# Patient Record
Sex: Female | Born: 1950
Health system: Southern US, Community
[De-identification: ages and names within clinical notes are randomized; demographics above are authoritative.]

## PROBLEM LIST (undated history)

## (undated) DIAGNOSIS — C50911 Malignant neoplasm of unspecified site of right female breast: Secondary | ICD-10-CM

## (undated) DIAGNOSIS — Z1371 Encounter for nonprocreative screening for genetic disease carrier status: Secondary | ICD-10-CM

## (undated) DIAGNOSIS — G709 Myoneural disorder, unspecified: Secondary | ICD-10-CM

## (undated) DIAGNOSIS — F419 Anxiety disorder, unspecified: Secondary | ICD-10-CM

## (undated) DIAGNOSIS — E7800 Pure hypercholesterolemia, unspecified: Secondary | ICD-10-CM

## (undated) DIAGNOSIS — I10 Essential (primary) hypertension: Secondary | ICD-10-CM

## (undated) HISTORY — PX: LUMBAR LAMINECTOMY: SHX95

## (undated) HISTORY — PX: BACK SURGERY: SHX140

## (undated) HISTORY — PX: BREAST BIOPSY: SHX20

## (undated) HISTORY — PX: ABDOMINAL HYSTERECTOMY: SHX81

---

## 1997-05-14 ENCOUNTER — Ambulatory Visit (HOSPITAL_COMMUNITY): Admission: RE | Admit: 1997-05-14 | Discharge: 1997-05-14 | Payer: Self-pay | Admitting: *Deleted

## 1998-03-07 ENCOUNTER — Ambulatory Visit (HOSPITAL_COMMUNITY): Admission: RE | Admit: 1998-03-07 | Discharge: 1998-03-07 | Payer: Self-pay | Admitting: *Deleted

## 1999-04-19 ENCOUNTER — Encounter: Payer: Self-pay | Admitting: General Surgery

## 1999-04-19 ENCOUNTER — Encounter: Admission: RE | Admit: 1999-04-19 | Discharge: 1999-04-19 | Payer: Self-pay | Admitting: General Surgery

## 2000-04-19 ENCOUNTER — Encounter: Admission: RE | Admit: 2000-04-19 | Discharge: 2000-04-19 | Payer: Self-pay | Admitting: *Deleted

## 2001-07-18 ENCOUNTER — Encounter: Payer: Self-pay | Admitting: *Deleted

## 2001-07-18 ENCOUNTER — Encounter: Admission: RE | Admit: 2001-07-18 | Discharge: 2001-07-18 | Payer: Self-pay | Admitting: *Deleted

## 2002-07-29 ENCOUNTER — Encounter: Payer: Self-pay | Admitting: *Deleted

## 2002-07-29 ENCOUNTER — Encounter: Admission: RE | Admit: 2002-07-29 | Discharge: 2002-07-29 | Payer: Self-pay | Admitting: *Deleted

## 2004-01-18 ENCOUNTER — Encounter: Admission: RE | Admit: 2004-01-18 | Discharge: 2004-01-18 | Payer: Self-pay | Admitting: *Deleted

## 2005-07-09 ENCOUNTER — Encounter: Admission: RE | Admit: 2005-07-09 | Discharge: 2005-07-09 | Payer: Self-pay | Admitting: *Deleted

## 2006-11-11 ENCOUNTER — Encounter: Admission: RE | Admit: 2006-11-11 | Discharge: 2006-11-11 | Payer: Self-pay | Admitting: *Deleted

## 2008-02-24 ENCOUNTER — Encounter: Admission: RE | Admit: 2008-02-24 | Discharge: 2008-02-24 | Payer: Self-pay | Admitting: Family Medicine

## 2009-06-07 ENCOUNTER — Encounter: Admission: RE | Admit: 2009-06-07 | Discharge: 2009-06-07 | Payer: Self-pay | Admitting: *Deleted

## 2010-01-22 ENCOUNTER — Encounter: Payer: Self-pay | Admitting: *Deleted

## 2010-07-19 ENCOUNTER — Other Ambulatory Visit: Payer: Self-pay | Admitting: Family Medicine

## 2010-07-19 DIAGNOSIS — Z1231 Encounter for screening mammogram for malignant neoplasm of breast: Secondary | ICD-10-CM

## 2010-08-08 ENCOUNTER — Ambulatory Visit
Admission: RE | Admit: 2010-08-08 | Discharge: 2010-08-08 | Disposition: A | Discharge status: Home / Self Care | Payer: BC Managed Care – PPO | Source: Ambulatory Visit | Attending: Family Medicine | Admitting: Family Medicine

## 2010-08-08 DIAGNOSIS — Z1231 Encounter for screening mammogram for malignant neoplasm of breast: Secondary | ICD-10-CM

## 2011-09-14 ENCOUNTER — Other Ambulatory Visit: Payer: Self-pay | Admitting: Family Medicine

## 2011-09-14 ENCOUNTER — Other Ambulatory Visit: Payer: Self-pay | Admitting: *Deleted

## 2011-09-14 DIAGNOSIS — Z1231 Encounter for screening mammogram for malignant neoplasm of breast: Secondary | ICD-10-CM

## 2011-10-12 ENCOUNTER — Ambulatory Visit: Payer: BC Managed Care – PPO

## 2011-11-12 ENCOUNTER — Ambulatory Visit: Payer: BC Managed Care – PPO

## 2011-12-20 ENCOUNTER — Ambulatory Visit: Payer: BC Managed Care – PPO

## 2012-01-10 ENCOUNTER — Other Ambulatory Visit: Payer: Self-pay | Admitting: Family Medicine

## 2012-01-10 ENCOUNTER — Ambulatory Visit
Admission: RE | Admit: 2012-01-10 | Discharge: 2012-01-10 | Disposition: A | Discharge status: Home / Self Care | Payer: BC Managed Care – PPO | Source: Ambulatory Visit | Attending: Family Medicine | Admitting: Family Medicine

## 2012-01-10 ENCOUNTER — Ambulatory Visit
Admission: RE | Admit: 2012-01-10 | Discharge: 2012-01-10 | Disposition: A | Discharge status: Home / Self Care | Payer: BC Managed Care – PPO | Source: Ambulatory Visit | Attending: *Deleted | Admitting: *Deleted

## 2012-01-10 DIAGNOSIS — N6310 Unspecified lump in the right breast, unspecified quadrant: Secondary | ICD-10-CM

## 2012-01-10 DIAGNOSIS — Z1231 Encounter for screening mammogram for malignant neoplasm of breast: Secondary | ICD-10-CM

## 2013-03-09 ENCOUNTER — Other Ambulatory Visit: Payer: Self-pay

## 2013-03-09 DIAGNOSIS — Z1231 Encounter for screening mammogram for malignant neoplasm of breast: Secondary | ICD-10-CM

## 2013-03-27 ENCOUNTER — Ambulatory Visit
Admission: RE | Admit: 2013-03-27 | Discharge: 2013-03-27 | Disposition: A | Discharge status: Home / Self Care | Payer: BC Managed Care – PPO | Source: Ambulatory Visit

## 2013-03-27 ENCOUNTER — Ambulatory Visit
Admission: RE | Admit: 2013-03-27 | Discharge: 2013-03-27 | Disposition: A | Discharge status: Home / Self Care | Payer: BC Managed Care – PPO | Source: Ambulatory Visit | Attending: Family Medicine | Admitting: Family Medicine

## 2013-03-27 ENCOUNTER — Other Ambulatory Visit: Payer: Self-pay | Admitting: Family Medicine

## 2013-03-27 DIAGNOSIS — N6310 Unspecified lump in the right breast, unspecified quadrant: Secondary | ICD-10-CM

## 2013-03-27 DIAGNOSIS — Z1231 Encounter for screening mammogram for malignant neoplasm of breast: Secondary | ICD-10-CM

## 2014-05-24 ENCOUNTER — Other Ambulatory Visit: Payer: Self-pay

## 2014-05-24 DIAGNOSIS — Z1231 Encounter for screening mammogram for malignant neoplasm of breast: Secondary | ICD-10-CM

## 2014-06-18 ENCOUNTER — Ambulatory Visit
Admission: RE | Admit: 2014-06-18 | Discharge: 2014-06-18 | Disposition: A | Discharge status: Home / Self Care | Payer: BC Managed Care – PPO | Source: Ambulatory Visit

## 2014-06-18 DIAGNOSIS — Z1231 Encounter for screening mammogram for malignant neoplasm of breast: Secondary | ICD-10-CM

## 2015-01-02 DIAGNOSIS — C50911 Malignant neoplasm of unspecified site of right female breast: Secondary | ICD-10-CM

## 2015-01-02 HISTORY — DX: Malignant neoplasm of unspecified site of right female breast: C50.911

## 2015-01-02 HISTORY — PX: BREAST BIOPSY: SHX20

## 2015-07-22 ENCOUNTER — Other Ambulatory Visit: Payer: Self-pay | Admitting: Family Medicine

## 2015-07-22 DIAGNOSIS — Z1231 Encounter for screening mammogram for malignant neoplasm of breast: Secondary | ICD-10-CM

## 2015-08-02 ENCOUNTER — Ambulatory Visit: Payer: BC Managed Care – PPO

## 2015-08-08 ENCOUNTER — Ambulatory Visit
Admission: RE | Admit: 2015-08-08 | Discharge: 2015-08-08 | Disposition: A | Discharge status: Home / Self Care | Payer: BC Managed Care – PPO | Source: Ambulatory Visit | Attending: Family Medicine | Admitting: Family Medicine

## 2015-08-08 DIAGNOSIS — Z1231 Encounter for screening mammogram for malignant neoplasm of breast: Secondary | ICD-10-CM

## 2015-08-09 ENCOUNTER — Other Ambulatory Visit: Payer: Self-pay | Admitting: Family Medicine

## 2015-08-09 DIAGNOSIS — R928 Other abnormal and inconclusive findings on diagnostic imaging of breast: Secondary | ICD-10-CM

## 2015-08-10 ENCOUNTER — Ambulatory Visit
Admission: RE | Admit: 2015-08-10 | Discharge: 2015-08-10 | Disposition: A | Discharge status: Home / Self Care | Payer: BC Managed Care – PPO | Source: Ambulatory Visit | Attending: Family Medicine | Admitting: Family Medicine

## 2015-08-10 ENCOUNTER — Other Ambulatory Visit: Payer: Self-pay | Admitting: Family Medicine

## 2015-08-10 ENCOUNTER — Other Ambulatory Visit: Payer: BC Managed Care – PPO

## 2015-08-10 DIAGNOSIS — IMO0002 Reserved for concepts with insufficient information to code with codable children: Secondary | ICD-10-CM

## 2015-08-10 DIAGNOSIS — R928 Other abnormal and inconclusive findings on diagnostic imaging of breast: Secondary | ICD-10-CM

## 2015-08-10 DIAGNOSIS — R229 Localized swelling, mass and lump, unspecified: Principal | ICD-10-CM

## 2015-08-10 DIAGNOSIS — N6310 Unspecified lump in the right breast, unspecified quadrant: Secondary | ICD-10-CM

## 2015-08-18 ENCOUNTER — Other Ambulatory Visit: Payer: Self-pay | Admitting: General Surgery

## 2015-08-18 DIAGNOSIS — C50911 Malignant neoplasm of unspecified site of right female breast: Secondary | ICD-10-CM

## 2015-08-19 ENCOUNTER — Encounter: Payer: Self-pay | Admitting: Oncology

## 2015-08-19 ENCOUNTER — Telehealth: Payer: Self-pay | Admitting: Oncology

## 2015-08-19 NOTE — Telephone Encounter (Signed)
Pt confirmed both genetic and onc appts, answered questions, completed intake, mailed pt letter, faxed appt to [facility]

## 2015-08-26 ENCOUNTER — Ambulatory Visit
Admission: RE | Admit: 2015-08-26 | Discharge: 2015-08-26 | Disposition: A | Discharge status: Home / Self Care | Payer: BC Managed Care – PPO | Source: Ambulatory Visit | Attending: General Surgery | Admitting: General Surgery

## 2015-08-26 DIAGNOSIS — C50911 Malignant neoplasm of unspecified site of right female breast: Secondary | ICD-10-CM

## 2015-08-26 MED ORDER — GADOBENATE DIMEGLUMINE 529 MG/ML IV SOLN
17.0000 mL | Freq: Once | INTRAVENOUS | Status: AC | PRN
  Administered 2015-08-26: 17 mL via INTRAVENOUS

## 2015-08-29 DIAGNOSIS — C50411 Malignant neoplasm of upper-outer quadrant of right female breast: Secondary | ICD-10-CM | POA: Insufficient documentation

## 2015-08-29 NOTE — Assessment & Plan Note (Deleted)
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 Clinical staging: T2 N0 stage IIa  Pathology and radiology counseling:Discussed with the patient, the details of pathology including the type of breast cancer,the clinical staging, the significance of ER, PR and HER-2/neu receptors and the implications for treatment. After reviewing the pathology in detail, we proceeded to discuss the different treatment options between surgery, radiation, chemotherapy, antiestrogen therapies.  Recommendations: 1. Breast conserving surgery versus mastectomy followed by 2. Oncotype DX testing to determine if chemotherapy would be of any benefit followed by 3. Adjuvant radiation therapy followed by 4. Adjuvant antiestrogen therapy  Oncotype counseling: I discussed Oncotype DX test. I explained to the patient that this is a 21 gene panel to evaluate patient tumors DNA to calculate recurrence score. This would help determine whether patient has high risk or intermediate risk or low risk breast cancer. She understands that if her tumor was found to be high risk, she would benefit from systemic chemotherapy. If low risk, no need of chemotherapy. If she was found to be intermediate risk, we would need to evaluate the score as well as other risk factors and determine if an abbreviated chemotherapy may be of benefit.  Plan: Radiology recommended additional biopsies if breast conserving therapy is being planned. I discussed with Dr. Dalbert Batman regarding the MRI findings.  Return to clinic after surgery to discuss final pathology report and then determine if Oncotype DX testing will need to be sent.

## 2015-08-30 ENCOUNTER — Other Ambulatory Visit: Payer: BC Managed Care – PPO

## 2015-08-30 ENCOUNTER — Encounter: Payer: Self-pay | Admitting: Hematology and Oncology

## 2015-08-30 ENCOUNTER — Encounter: Payer: Self-pay | Admitting: *Deleted

## 2015-08-30 ENCOUNTER — Ambulatory Visit (HOSPITAL_BASED_OUTPATIENT_CLINIC_OR_DEPARTMENT_OTHER): Payer: Self-pay | Admitting: Genetic Counselor

## 2015-08-30 ENCOUNTER — Ambulatory Visit: Payer: BC Managed Care – PPO | Admitting: Oncology

## 2015-08-30 ENCOUNTER — Ambulatory Visit (HOSPITAL_BASED_OUTPATIENT_CLINIC_OR_DEPARTMENT_OTHER): Payer: BC Managed Care – PPO | Admitting: Hematology and Oncology

## 2015-08-30 DIAGNOSIS — C50411 Malignant neoplasm of upper-outer quadrant of right female breast: Secondary | ICD-10-CM

## 2015-08-30 DIAGNOSIS — Z809 Family history of malignant neoplasm, unspecified: Secondary | ICD-10-CM

## 2015-08-30 DIAGNOSIS — Z808 Family history of malignant neoplasm of other organs or systems: Secondary | ICD-10-CM

## 2015-08-30 DIAGNOSIS — Z8041 Family history of malignant neoplasm of ovary: Secondary | ICD-10-CM

## 2015-08-30 DIAGNOSIS — D0591 Unspecified type of carcinoma in situ of right breast: Secondary | ICD-10-CM | POA: Diagnosis not present

## 2015-08-30 DIAGNOSIS — Z800 Family history of malignant neoplasm of digestive organs: Secondary | ICD-10-CM

## 2015-08-30 DIAGNOSIS — Z801 Family history of malignant neoplasm of trachea, bronchus and lung: Secondary | ICD-10-CM

## 2015-08-30 DIAGNOSIS — Z170 Estrogen receptor positive status [ER+]: Secondary | ICD-10-CM

## 2015-08-30 DIAGNOSIS — Z85828 Personal history of other malignant neoplasm of skin: Secondary | ICD-10-CM

## 2015-08-30 DIAGNOSIS — Z803 Family history of malignant neoplasm of breast: Secondary | ICD-10-CM

## 2015-08-30 MED ORDER — ANASTROZOLE 1 MG PO TABS: 1.0000 mg | ORAL_TABLET | Freq: Every day | ORAL | 3 refills | Status: DC

## 2015-08-30 NOTE — Progress Notes (Signed)
[place] CONSULT NOTE  Consulting physician: Dr. Dalbert Batman  CHIEF COMPLAINTS/PURPOSE OF CONSULTATION:  Newly diagnosed breast cancer  HISTORY OF PRESENTING ILLNESS:  Stephanie Hinton 65 y.o. female is here because of recent diagnosis of right breast cancer. Patient had a routine screening mammogram the detected abnormality in the right breast this was further evaluated by ultrasound. By ultrasound it measured 2.8 cm. 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 It was grade 2. she saw Dr. Dalbert Batman who obtained a breast MRI. The MRI done recently revealed that they were multicentric breast disease with multiple lesions that measure 2.1 cm down to 0.8 cm. She is also seeing genetics for genetic testing because her sister was diagnosed breast cancer age 60. She is extremely anxious and worried about the time it's taking for her surgery.  I reviewed her records extensively and collaborated the history with the patient.  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 She does not have any children. FAMILY HISTORY: Sister diagnosed with breast cancers 24 apparently was BRCA negative. ALLERGIES:  is allergic to  penicillin g.  MEDICATIONS:  Current Outpatient Prescriptions  Medication Sig Dispense Refill  . atorvastatin (LIPITOR) 20 MG tablet TAKE 1 TABLET DAILY    . citalopram (CELEXA) 20 MG tablet TAKE 1 TABLET DAILY    . metoprolol succinate (TOPROL-XL) 25 MG 24 hr tablet TAKE 1 TABLET DAILY    . anastrozole (ARIMIDEX) 1 MG tablet Take 1 tablet (1 mg total) by mouth daily. 90 tablet 3   No current facility-administered medications for this visit.     REVIEW OF SYSTEMS:   Constitutional: Denies fevers, chills or abnormal night sweats Eyes: Denies blurriness of vision, double vision or watery eyes Ears, nose, mouth, throat, and face: Denies mucositis or sore throat Respiratory: Denies cough, dyspnea or wheezes Cardiovascular: Denies palpitation, chest discomfort or lower extremity swelling Gastrointestinal:  Denies nausea, heartburn or change in bowel habits Skin: Denies abnormal skin rashes Lymphatics: Denies new lymphadenopathy or easy bruising Neurological:Denies numbness, tingling or new weaknesses Behavioral/Psych: Mood is stable, no new changes  Breast:  Denies any palpable lumps or discharge All other systems were reviewed with the patient and are negative.  PHYSICAL EXAMINATION: ECOG PERFORMANCE STATUS: 0 - Asymptomatic  Vitals:   08/30/15 1235  BP: (!) 140/91  Pulse: 71  Resp: 18  Temp: 98.3 F (36.8 C)   Filed Weights   08/30/15 1235  Weight: 178 lb 11.2 oz (81.1 kg)    GENERAL:alert, no distress and comfortable SKIN: skin color, texture, turgor are normal, no rashes or significant lesions EYES: normal, conjunctiva are pink and non-injected, sclera clear OROPHARYNX:no exudate, no erythema and lips, buccal mucosa, and tongue normal  NECK: supple, thyroid normal size, non-tender, without nodularity LYMPH:  no palpable lymphadenopathy in the cervical, axillary or inguinal LUNGS: clear to auscultation and percussion with normal breathing effort HEART: regular rate &  rhythm and no murmurs and no lower extremity edema ABDOMEN:abdomen soft, non-tender and normal bowel sounds Musculoskeletal:no cyanosis of digits and no clubbing  PSYCH: alert & oriented x 3 with fluent speech NEURO: no focal motor/sensory deficits BREAST: No palpable nodules in breast. No palpable axillary or supraclavicular lymphadenopathy (exam performed in the presence of a chaperone)   RADIOGRAPHIC STUDIES: I have personally reviewed the radiological reports and agreed with the findings in the report.  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 After reviewing the pathology in detail, we proceeded to discuss the different treatment options between surgery, radiation, chemotherapy, antiestrogen therapies.  Recommendations: 1. Mastectomy  2. Oncotype DX testing to determine if chemotherapy would be of any benefit followed by 3. Adjuvant radiation therapy if necessary based upon final tumor characteristics followed by 4. Adjuvant antiestrogen therapy  Oncotype counseling: I discussed Oncotype DX test. I explained to the patient that this is a 21 gene panel to evaluate patient tumors DNA to calculate recurrence score. This would help determine whether patient has high risk or intermediate risk or low risk breast cancer. She understands that  if her tumor was found to be high risk, she would benefit from systemic chemotherapy. If low risk, no need of chemotherapy. If she was found to be intermediate risk, we would need to evaluate the score as well as other risk factors and determine if an abbreviated chemotherapy may be of benefit.  Plan: 1. Genetic testing today 2. Oncotype DX on the biopsy 3. Start anastrozole 1 mg daily today  Return to clinic one week after final surgery to discuss the final treatment plan.    All questions were answered. The patient knows to call the clinic with any problems, questions or concerns.    Rulon Eisenmenger, MD 08/30/15

## 2015-08-30 NOTE — Assessment & Plan Note (Signed)
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 After reviewing the pathology in detail, we proceeded to discuss the different treatment options between surgery, radiation, chemotherapy, antiestrogen therapies.  Recommendations: 1. Mastectomy  2. Oncotype DX testing to determine if chemotherapy would be of any benefit followed by 3. Adjuvant radiation therapy if necessary based upon final tumor characteristics followed by 4. Adjuvant antiestrogen therapy  Oncotype counseling: I discussed Oncotype DX test. I explained to the patient that this is a 21 gene panel to evaluate patient tumors DNA to calculate recurrence score. This would help determine whether patient has high risk or intermediate risk or low risk breast cancer. She understands that if her tumor was found to be high risk, she would benefit from systemic chemotherapy. If low risk, no need of chemotherapy. If she was found to be intermediate risk, we would need to evaluate the score as well as other risk factors and determine if an abbreviated chemotherapy may be of benefit.  Plan: 1. Genetic testing today 2. Oncotype DX on the biopsy 3. Start anastrozole 1 mg daily today  Return to clinic one week after final surgery to discuss the final treatment plan.

## 2015-08-31 ENCOUNTER — Telehealth: Payer: Self-pay | Admitting: *Deleted

## 2015-08-31 ENCOUNTER — Encounter: Payer: Self-pay | Admitting: Genetic Counselor

## 2015-08-31 DIAGNOSIS — Z803 Family history of malignant neoplasm of breast: Secondary | ICD-10-CM | POA: Insufficient documentation

## 2015-08-31 NOTE — Telephone Encounter (Signed)
Received order per Dr. Lindi Adie for oncotype testing. Requisition sent to pathology. Received by Varney Biles. PAC sent to [facility]

## 2015-08-31 NOTE — Progress Notes (Signed)
REFERRING PROVIDER: Claud Kelp, MD [address] [place], [place] [address]  PRIMARY PROVIDER:  No primary care provider on file.  PRIMARY REASON FOR VISIT:  1. Breast cancer of upper-outer quadrant of right female breast ([facility]CC)   2. Family history of breast cancer in first degree relative   3. Family history of malignant neoplasm of gastrointestinal tract   4. Family history of ovarian cancer   5. Family history of lung cancer   6. Family history of skin cancer   7. History of basal cell carcinoma   8. Family history of cancer      HISTORY OF PRESENT ILLNESS:   Ms. Rajewski, a 65 y.o. female, was seen for a [facility] cancer genetics consultation at the request of Dr. Derrell Lolling due to a personal history of breast cancer and family history of breast, early stage ovarian, GI, and other cancers.  Ms. Younis presents to clinic today with her significant other and her sister, Nicholos Johns, to discuss the possibility of a hereditary predisposition to cancer, genetic testing, and to further clarify her future cancer risks, as well as potential cancer risks for family members.   In August 2017, at the age of 74, Ms. Guinyard was diagnosed with invasive ductal carcinoma with DCIS of the right breast.  Hormone receptor status is ER/PR+, Her2-. Genetic testing will help inform surgical and treatment decisions.  Ms. Tufte also reports a history of basal cell carcinoma, diagnosed and removed one year ago.   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  First live birth at age - no children.  OCP use for approximately 10 years.  Ovaries intact: still has left ovary.  Hysterectomy: yes, for pain and endometriosis; also has right oophorectomy in her 63s.  Menopausal status: postmenopausal.  HRT use: 0 years. Colonoscopy: yes; has had 3 colonoscopies; only one tubular adenoma found; is on a 5-year schedule. Mammogram within the last year: yes. Number of breast biopsies: 3; history of 2 benign biopsies of the left breast--approx 15 years ago. Up to date with pelvic exams:  yes. Any excessive radiation exposure in the past:  no   Social History   Social History  . Marital status: Divorced    Spouse name: N/A  . Number of children: N/A  . Years of education: N/A   Social History Main Topics  . Smoking status: Never Smoker  . Smokeless tobacco: Never Used  . Alcohol use Yes     Comment: 1 glass wine per day  . Drug use: Unknown  . Sexual activity: Not Asked   Other Topics Concern  . None   Social History Narrative  . None     FAMILY HISTORY:  We obtained a detailed, 4-generation family history.  Significant diagnoses are listed below: Family History  Problem Relation Age of Onset  . Skin cancer Mother     BCC and SCCs  . Heart disease  Father   . Congestive Heart Failure Father   . COPD Father     smoker  . Skin cancer Brother     outside a lot; unspecified type  . Lung cancer Maternal Aunt     d. late 64s; former smoker  . Cancer Paternal Aunt     d. 64s; intestinal/colon cancer  . Cancer Paternal Uncle     d. 94s; blood or bone cancer  . Other Maternal Grandmother     "sores on her legs"; very frail  . Heart disease Paternal Grandmother     d. early 31s  . Skin cancer Sister     BCC  . Colon polyps Sister     approx 3 polyps dx 61y or younger  . Breast cancer Sister 29    reportedly negative BRCA1/2 in the 1990s  . Ovarian cancer Sister 87    "early BL ovarian cancer/pre-cancer"  s/p BSO  . Heart Problems Maternal Aunt   . Diabetes Maternal Aunt   . Heart Problems Paternal Aunt   . Lupus Paternal Aunt   . Breast cancer Paternal Aunt     dx 50s-60s  . Emphysema Paternal Aunt   . Alzheimer's disease Paternal Uncle   . Heart disease Paternal Uncle   . COPD Paternal Uncle   . Heart disease Paternal Uncle   . Cancer Cousin     paternal 1st cousin dx late 77s; NOS cancer    Ms. Mccready has four full sisters, two full brothers, and one maternal half-brother.  Two of her sisters are identical twins.  One of the twins, Nicholos Johns, has a history of basal cell skin cancer and a history of approximately 3 colon polyps.  One of Ms. Nakayama's other sisters, Darl Pikes, is currently 39 and was diagnosed with breast cancer at 26.  She was also diagnosed with a bilateral "early ovarian cancer/pre-cancer" at the age of 18.  This was treated with a TAH-BSO.  This sister also reportedly had negative BRCA1/2 genetic testing approximately 20 years ago.  This sister has no children.  The other two sisters are currently in their early 77s or early 20s and are cancer-free.  One of Ms. Choung's full brothers is currently 29 and has a history of skin cancer, but she reports that he spends a lot of time outside.  Her other full brother is 58 and is cancer-free.  Her maternal half-brother is currently 43 and is also cancer-free.    Ms. Schlag mother is 59 years old and has a history of skin cancers (basal and squamous cell carcinomas).  Ms. Lukacs mother has three full brothers and four full sisters, all of whom have passed away, most at later ages.   One sister died in her late 30s-early 85s, after giving birth.  One uncle died in a accident at a young age.  None of these aunts/uncles have had cancer to Ms. Scoville's knowledge.  She also reports no known cancer history for her maternal first cousins.  Her maternal grandmother passed away in her late 69s-early 27s from an unspecified cause.  Ms. Faxon  maternal grandfather passed away in his 61s, but not from cancer.  Ms. Crossland had no information for her maternal great aunts/uncles and great grandparents.  Ms. Dosch father was a smoker and he passed at age 6 related to heart disease, congestive heart failure, and COPD.  He had three full brothers and five full sisters.  One sister is currently 33; the remaining siblings have  passed away.  This sister has a history of breast cancer, diagnosed in her 84s-60s.  She has two daughters who are cancer-free.  One sister died of an intestinal or colon cancer in her 38s.  She had no children.  One brother died of a blood or bone cancer in his 29s.  One paternal first cousin has a history of an unspecified type of cancer, diagnosed in her late 33s.  Ms. Keough paternal grandmother died of heart disease in her early 45s.  Her grandfather passed away in his 92s.  She has no further information for the paternal family history.  Patient's maternal ancestors are of [place] descent, and paternal ancestors are of [place] descent. There is no reported Ashkenazi Jewish ancestry. There is no known consanguinity.  GENETIC COUNSELING ASSESSMENT: Anelis Hrivnak is a 65 y.o. female with a personal and family history of cancer which is somewhat suggestive of a hereditary cancer syndrome and predisposition to cancer. We, therefore, discussed and recommended the following at today's visit.   DISCUSSION: We reviewed the characteristics, features and inheritance patterns of hereditary cancer syndromes, particularly those caused by mutations within the BRCA1/2 and Lynch syndrome genes. We also discussed genetic testing, including the appropriate family members to test, the process of testing, insurance coverage and turn-around-time for results. We discussed the implications of a negative, positive and/or variant of uncertain significant result. We recommended Ms. Blinder pursue genetic testing for the 32-gene Comprehensive  Cancer Panel with MSH2 Exons 1-7 Inversion Analysis. The Comprehensive Cancer Panel offered by [facility] includes sequencing and/or deletion duplication testing of the following 32 genes: APC, ATM, AXIN2, BARD1, BMPR1A, BRCA1, BRCA2, BRIP1, CDH1, CDK4, CDKN2A, CHEK2, EPCAM, FANCC, MLH1, MSH2, MSH6, MUTYH, NBN, PALB2, PMS2, POLD1, POLE, PTEN, RAD51C, RAD51D, SCG5/GREM1, SMAD4, STK11, TP53, VHL, and XRCC2.    Based on Ms. Chappuis's personal and family history of cancer, she meets medical criteria for genetic testing. Despite that she meets criteria, she may still have an out of pocket cost. We discussed that if her out of pocket cost for testing is over $100, the laboratory will call and confirm whether she wants to proceed with testing.  If the out of pocket cost of testing is less than $100 she will be billed by the genetic testing laboratory.   PLAN: After considering the risks, benefits, and limitations, Ms. Merrick  provided informed consent to pursue genetic testing and the blood sample was sent to [facility] for analysis of the 32-gene Comprehensive Cancer Panel with MSH2 Exons 1-7 Inversion Analysis. Results should be available within approximately 2-3 weeks' time, at which point they will be disclosed by telephone to Ms. Klutts, as will any additional recommendations warranted by these results. Ms. Rathel will receive a summary of her genetic counseling visit and a copy of her results once available. This information will also be available in Epic. We encouraged Ms. Colborn to remain in contact with cancer genetics annually so that we can continuously update the family history and inform her of any changes in cancer genetics and testing that may be of benefit for her family. Ms. Kemler questions were answered to her satisfaction today. Our contact information was provided should additional questions or concerns arise.  Thank you for the referral and allowing [place] to share in the care of your patient.    Jeanine Luz, MS, [facility] Certified Genetic Counselor [email or web address] Phone: [phone]  The patient was seen for a total of 60 minutes in face-to-face genetic counseling.  This patient was  discussed with Drs. Magrinat, Lindi Adie and/or Burr Medico who agrees with the above.    _______________________________________________________________________ For Office Staff:  Number of people involved in session: 3 Was an Intern/ student involved with case: no

## 2015-09-05 ENCOUNTER — Other Ambulatory Visit: Payer: Self-pay | Admitting: Oncology

## 2015-09-09 ENCOUNTER — Ambulatory Visit: Payer: Self-pay | Admitting: Genetic Counselor

## 2015-09-09 ENCOUNTER — Telehealth: Payer: Self-pay | Admitting: *Deleted

## 2015-09-09 ENCOUNTER — Telehealth: Payer: Self-pay | Admitting: Genetic Counselor

## 2015-09-09 DIAGNOSIS — C50411 Malignant neoplasm of upper-outer quadrant of right female breast: Secondary | ICD-10-CM

## 2015-09-09 DIAGNOSIS — Z1379 Encounter for other screening for genetic and chromosomal anomalies: Secondary | ICD-10-CM

## 2015-09-09 DIAGNOSIS — Z809 Family history of malignant neoplasm, unspecified: Secondary | ICD-10-CM

## 2015-09-09 DIAGNOSIS — Z803 Family history of malignant neoplasm of breast: Secondary | ICD-10-CM

## 2015-09-09 NOTE — Telephone Encounter (Signed)
Received Oncotype Dx score of 36/25%.  Gave a copy to Dr. Lindi Adie, placed a copy on Keisha's desk and took one to HIM to scan.

## 2015-09-09 NOTE — Telephone Encounter (Signed)
Discussed with Ms. Weinand that her genetic test results were negative for pathogenic mutations within any of 32 genes on the Comprehensive Cancer Panel through [facility].  Additionally, no uncertain changes were found.  Discussed that this result seems to be reassuring that her cancer was sporadic.  Discussed updated genetic testing for her sister Manuela Schwartz.  Recommended that Ms. Eudy continue to follow her doctors' recommendations for future cancer screening.  She is welcome to call and update the family history with [place], if any one has genetic testing or if any family members get diagnosed with new cancers.  Advised that women in the close family are at a higher risk for breast cancer due to the family history.  Advised her nieces to begin mammograms at 75 (since her sister was diagnosed at 68).  Ms. Obryan unaffected sisters are likely eligible for breast MRIs.  Ms. Linnehan would like an emailed copy of her result, and I am happy to send her one.  She is welcome to call or email with any questions.

## 2015-09-11 DIAGNOSIS — Z1379 Encounter for other screening for genetic and chromosomal anomalies: Secondary | ICD-10-CM | POA: Insufficient documentation

## 2015-09-11 NOTE — Progress Notes (Signed)
GENETIC TEST RESULT  HPI: [name] was previously seen in the [facility] due to a personal history of breast cancer and family history of breast, ovarian, GI, and other cancers, and concerns regarding a hereditary predisposition to cancer. Please refer to our prior cancer genetics clinic note from August 30, 2015 for more information regarding [name]'s medical, social and family histories, and our assessment and recommendations, at the time. [name] recent genetic test results were disclosed to her, as were recommendations warranted by these results. These results and recommendations are discussed in more detail below.  GENETIC TEST RESULTS: At the time of [name]'s visit on 08/30/15, we recommended she pursue genetic testing of the 32-gene Comprehensive Cancer Panel with MSH2 Exons 1-7 Inversion Analysis through [facility] [employer] [facility]. The Comprehensive Cancer Panel offered by [facility] includes sequencing and/or deletion duplication testing of the following 32 genes: APC, ATM, AXIN2, BARD1, BMPR1A, BRCA1, BRCA2, BRIP1, CDH1, CDK4, CDKN2A, CHEK2, EPCAM, FANCC, MLH1, MSH2, MSH6, MUTYH, NBN, PALB2, PMS2, POLD1, POLE, PTEN, RAD51C, RAD51D, SCG5/GREM1, SMAD4, STK11, TP53, VHL, and XRCC2.  Those results are now back, the report date for which is September 08, 2015.  Genetic testing was normal, and did not reveal a deleterious mutation in these genes.  Additionally, no variants of uncertain significance (VUSes) were found. The test report will be scanned into EPIC and will be located under the Results Review tab in the Pathology>Molecular Pathology section.   We discussed with [name] that since the current genetic testing is not perfect, it is possible there may be a gene mutation in one of these genes that current testing cannot detect, but that chance is small. We also discussed, that it is possible that another gene that has not yet been discovered, or that we have not yet  tested, is responsible for the cancer diagnoses in the family, and it is, therefore, important to remain in touch with cancer genetics in the future so that we can continue to offer [name] the most up-to-date genetic testing.   CANCER SCREENING RECOMMENDATIONS: While we still do not have an explanation for the personal and family history of cancer, this result seems to be reassuring and indicates that [name] likely does not have an increased risk for a future cancer due to a mutation in one of these genes. This normal test also suggests that [name]'s cancer was most likely not due to an inherited predisposition associated with one of these genes.  Most cancers happen by chance and this negative test suggests that her cancer falls into this category.  We, therefore, recommended she continue to follow the cancer management and screening guidelines provided by her oncology and primary healthcare providers.   RECOMMENDATIONS FOR FAMILY MEMBERS: Women in this family are at an increased risk of developing cancer, over the general population risk, simply due to the family history of cancer. We recommended women in the close family (i.e. [name] nieces) have a yearly mammogram beginning at age 52 due to [name]'s history of breast cancer diagnosis at 89, an annual clinical breast exam, and perform monthly breast self-exams. [name] unaffected sisters are at a higher risk for breast cancer (since they now have two sisters with a history of breast cancer) and are likely eligible for additional breast MRI screening.  Women in this family should also have a gynecological exam as recommended by their primary provider and should make their primary physician aware of the family history of  ovarian cancer. All family members should have a colonoscopy by age 63, or younger based on early-onset colon cancer in the immediate family.  Based on [name]'s family history, we recommended her sister Stephanie Hinton,  who was diagnosed with breast cancer at age 16 and an early stage ovarian cancer, and who had negative genetic testing approximately 20 years ago, have updated genetic counseling and testing. [name] will let us know if we can be of any assistance in coordinating genetic counseling and/or testing for this family member.   FOLLOW-UP: Lastly, we discussed with [name] that cancer genetics is a rapidly advancing field and it is possible that new genetic tests will be appropriate for her and/or her family members in the future. We encouraged her to remain in contact with cancer genetics on an annual basis so we can update her personal and family histories and let her know of advances in cancer genetics that may benefit this family.   Our contact number was provided. [name] questions were answered to her satisfaction, and she knows she is welcome to call us at anytime with additional questions or concerns.   Stephanie Luz, MS, [facility] Certified Genetic Counselor [email or web address] Phone: [phone]

## 2015-09-12 ENCOUNTER — Ambulatory Visit (HOSPITAL_BASED_OUTPATIENT_CLINIC_OR_DEPARTMENT_OTHER): Payer: BC Managed Care – PPO | Admitting: Hematology and Oncology

## 2015-09-12 ENCOUNTER — Encounter: Payer: Self-pay | Admitting: Hematology and Oncology

## 2015-09-12 DIAGNOSIS — C50411 Malignant neoplasm of upper-outer quadrant of right female breast: Secondary | ICD-10-CM | POA: Diagnosis not present

## 2015-09-12 NOTE — Progress Notes (Signed)
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%, HER-2 negative, ratio 1.44      08/29/2015 Breast MRI    Multicentric breast cancer 4 masses 1.8 cm, 2.1 cm, 1.2 cm, 0.8 cm total spanning 5.9 x 3.6 x 3.3 cm linear non-mass enhancement extending anteriorly suspicious for DCIS total dimension 5.2 cm no adenopathy      09/05/2015 Procedure    Genetic testing: Normal Oncotype DX score 36, high risk, 25% risk of recurrence with tamoxifen alone       CHIEF COMPLIANT: Follow-up to discuss Oncotype score  INTERVAL HISTORY: Stephanie Hinton is a 65 year old with above-mentioned history of right breast cancer who underwent Oncotype DX testing on the initial biopsy area and she is here today to discuss the results of the Oncotype DX score. It came back as high risk. So the patient will need systemic chemotherapy. She is accompanied by her family members were very concerned about the high risk Oncotype DX. Patient is in tears because she will need to undergo systemic chemotherapy. She is expecting to see plastic surgery and then finalize a treatment plan. In the interim she is currently on anastrozole. She has been tolerating it extremely well. She denies any hot flashes or myalgias.  REVIEW OF SYSTEMS:   Constitutional: Denies fevers, chills or abnormal weight loss Eyes: Denies blurriness of vision Ears, nose, mouth, throat, and face: Denies mucositis or sore throat Respiratory: Denies cough, dyspnea or wheezes Cardiovascular: Denies palpitation, chest discomfort Gastrointestinal:  Denies nausea, heartburn or change in bowel habits Skin: Denies abnormal skin rashes Lymphatics: Denies new lymphadenopathy or easy bruising Neurological:Denies numbness, tingling or new weaknesses Behavioral/Psych: Mood is stable, no new changes  Extremities:  No lower extremity edema  All other systems were reviewed with the patient and are negative.  I have reviewed the past medical history, past surgical history, social history and family history with the patient and they are unchanged from previous note.  ALLERGIES:  is allergic to penicillin g.  MEDICATIONS:  Current Outpatient Prescriptions  Medication Sig Dispense Refill  . anastrozole (ARIMIDEX) 1 MG tablet Take 1 tablet (1 mg total) by mouth daily. 90 tablet 3  . atorvastatin (LIPITOR) 20 MG tablet TAKE 1 TABLET DAILY    . citalopram (CELEXA) 20 MG tablet TAKE 1 TABLET DAILY    . metoprolol succinate (TOPROL-XL) 25 MG 24 hr tablet TAKE 1 TABLET DAILY     No current facility-administered medications for this visit.     PHYSICAL EXAMINATION: ECOG PERFORMANCE STATUS: 1 - Symptomatic but completely ambulatory  Vitals:   09/12/15 1514  BP: (!) 158/69  Pulse: 68  Resp: 18  Temp: 98.1 F (36.7 C)   Filed Weights   09/12/15 1514  Weight: 187 lb (84.8 kg)    GENERAL:alert, no distress and comfortable SKIN: skin color, texture, turgor are normal, no rashes or significant lesions EYES: normal, Conjunctiva are pink and non-injected, sclera clear OROPHARYNX:no exudate, no erythema and lips, buccal mucosa, and tongue normal  NECK: supple, thyroid normal size, non-tender, without nodularity LYMPH:  no palpable lymphadenopathy in the cervical, axillary or inguinal LUNGS: clear to auscultation and percussion with normal breathing effort HEART: regular rate & rhythm and no murmurs and no lower extremity edema ABDOMEN:abdomen soft, non-tender and normal bowel sounds MUSCULOSKELETAL:no cyanosis of digits and  no clubbing  NEURO: alert & oriented x 3 with fluent speech, no focal motor/sensory deficits EXTREMITIES: No lower extremity edema  LABORATORY DATA:  I have reviewed the data as listed   Chemistry   No results found for: NA, K, CL, CO2, BUN, CREATININE, GLU No results found for:  CALCIUM, ALKPHOS, AST, ALT, BILITOT     No results found for: WBC, HGB, HCT, MCV, PLT, NEUTROABS   ASSESSMENT & PLAN:  Breast cancer of upper-outer quadrant of right female breast ([place]) 08/10/2015 Right breast biopsy 10:00: Grade 2 IDC with DCIS, ER 100%, PR 10%, 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 Mastectomy  2. followed by adjuvant chemotherapy with dose dense Adriamycin Cytoxan 4 followed by Abraxane/ Taxol weekly 12  (patient may choose to undergo adjuvant chemotherapy if no one because it will be closer to her home) 3. Adjuvant radiation therapy if necessary based upon final tumor characteristics followed by 4. Adjuvant antiestrogen therapy with anastrozole 1 mg daily -------------------------------------------------------------------------------------------------------------------------------  Oncotype DX score: I discussed with the patient in detail about significance of high risk Oncotype DX score and hopefully pricks for high risk of recurrence but also predicts response to systemic chemotherapy.  Anastrozole toxicities: Patient describes no side effects to anastrozole therapy. Return to clinic one week after surgery to discuss the final pathology report. Patient may elect to undergo chemotherapy and follow-up at no one incident would be closer to her home.   No orders of the defined types were placed in this encounter.  The patient has a good understanding of the overall plan. she agrees with it. she will call with any problems that may develop before  the next visit here.   Rulon Eisenmenger, MD 09/12/15

## 2015-09-12 NOTE — Assessment & Plan Note (Signed)
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 Mastectomy  2. followed by adjuvant chemotherapy with dose dense Adriamycin Cytoxan 4 followed by Abraxane/ Taxol weekly 12 (patient may choose to undergo adjuvant chemotherapy if no one because it will be closer to her home) 3. Adjuvant radiation therapy if necessary based upon final tumor characteristics followed by 4. Adjuvant antiestrogen therapy with anastrozole 1 mg daily -------------------------------------------------------------------------------------------------------------------------------  Oncotype DX score: I discussed with the patient in detail about significance of high risk Oncotype DX score and hopefully pricks for high risk of recurrence but also predicts response to systemic chemotherapy.  Anastrozole toxicities: Patient describes no side effects to anastrozole therapy. Return to clinic one week after surgery to discuss the final pathology report. Patient may elect to undergo chemotherapy and follow-up at no one incident would be closer to her home.

## 2015-09-20 ENCOUNTER — Other Ambulatory Visit: Payer: Self-pay | Admitting: General Surgery

## 2015-09-25 ENCOUNTER — Other Ambulatory Visit: Payer: Self-pay | Admitting: General Surgery

## 2015-09-25 DIAGNOSIS — C50811 Malignant neoplasm of overlapping sites of right female breast: Secondary | ICD-10-CM

## 2015-09-28 ENCOUNTER — Ambulatory Visit
Admission: RE | Admit: 2015-09-28 | Discharge: 2015-09-28 | Disposition: A | Discharge status: Home / Self Care | Payer: BC Managed Care – PPO | Source: Ambulatory Visit | Attending: Radiation Oncology | Admitting: Radiation Oncology

## 2015-09-28 ENCOUNTER — Encounter: Payer: Self-pay | Admitting: Radiation Oncology

## 2015-09-28 ENCOUNTER — Telehealth: Payer: Self-pay | Admitting: Hematology and Oncology

## 2015-09-28 VITALS — BP 131/87 | HR 83 | Temp 97.9°F | Resp 18 | Ht 64.0 in | Wt 181.0 lb

## 2015-09-28 DIAGNOSIS — Z803 Family history of malignant neoplasm of breast: Secondary | ICD-10-CM | POA: Insufficient documentation

## 2015-09-28 DIAGNOSIS — Z8041 Family history of malignant neoplasm of ovary: Secondary | ICD-10-CM | POA: Diagnosis not present

## 2015-09-28 DIAGNOSIS — Z808 Family history of malignant neoplasm of other organs or systems: Secondary | ICD-10-CM | POA: Diagnosis not present

## 2015-09-28 DIAGNOSIS — C50411 Malignant neoplasm of upper-outer quadrant of right female breast: Secondary | ICD-10-CM | POA: Insufficient documentation

## 2015-09-28 DIAGNOSIS — Z9889 Other specified postprocedural states: Secondary | ICD-10-CM | POA: Diagnosis not present

## 2015-09-28 DIAGNOSIS — Z8249 Family history of ischemic heart disease and other diseases of the circulatory system: Secondary | ICD-10-CM | POA: Insufficient documentation

## 2015-09-28 DIAGNOSIS — Z833 Family history of diabetes mellitus: Secondary | ICD-10-CM | POA: Diagnosis not present

## 2015-09-28 DIAGNOSIS — Z170 Estrogen receptor positive status [ER+]: Secondary | ICD-10-CM | POA: Diagnosis not present

## 2015-09-28 HISTORY — DX: Encounter for nonprocreative screening for genetic disease carrier status: Z13.71

## 2015-09-28 NOTE — Telephone Encounter (Signed)
lvm to inform pt of 10/30 per LOS

## 2015-09-28 NOTE — Progress Notes (Signed)
Radiation Oncology         [phone]) [phone] ________________________________  Initial outpatient Consultation  Name: Stephanie Hinton MRN: [id number]  Date: 09/28/2015  DOB: 03/16/1950  CC:No primary care provider on file.  Nicholas Lose, MD   REFERRING PHYSICIAN: Nicholas Lose, MD  DIAGNOSIS: The encounter diagnosis was Breast cancer of upper-outer quadrant of right female breast ([employer]).    ICD-9-CM ICD-10-CM   1. Breast cancer of upper-outer quadrant of right female breast ([place]) 174.4 C50.411     HISTORY OF PRESENT ILLNESS: Stephanie Hinton is a 65 y.o. female seen at the request of Dr. Lindi Adie for a new diagnosis of right breast cancer. The patient has undergone biopsies previously for benign breast changes. She was seen for screening mammogram and was found to have several lesions in the right breast. 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 Her tumor was sent for oncotype and her score was 36. She had an MRI of the breast revealing 4 masses measuring 1.8, 2.1, 1.2, and .8 cm. She has been started on Anastrazole and plans to move forward with mastectomy, followed by adjuvant chemotherapy with Adriamycin/Cytoxan with Abraxane/Taxol. She is contemplating reconstruction with mastectomy either immediately versus after completing treatment. She comes today to discuss the possible role of radiotherapy.  PREVIOUS RADIATION THERAPY: No  PAST MEDICAL HISTORY:  Past Medical History:  Diagnosis Date  . Breast cancer ([place])       PAST SURGICAL HISTORY: Past Surgical History:  Procedure Laterality Date  . BREAST BIOPSY      FAMILY HISTORY:  Family History  Problem Relation Age of Onset  . Skin cancer Mother     BCC and SCCs  . Heart disease Father   . Congestive Heart Failure Father   . COPD Father     smoker  . Skin cancer Brother     outside a lot; unspecified type  . Lung  cancer Maternal Aunt     d. late 35s; former smoker  . Cancer Paternal Aunt     d. 63s; intestinal/colon cancer  . Cancer Paternal Uncle     d. 87s; blood or bone cancer  . Other Maternal Grandmother     "sores on her legs"; very frail  . Heart disease Paternal Grandmother     d. early 66s  . Skin cancer Sister     BCC  . Colon polyps Sister     approx 3 polyps dx 61y or younger  . Breast cancer Sister 7    reportedly negative BRCA1/2 in the 1990s  . Ovarian cancer Sister 80    "early BL ovarian cancer/pre-cancer" s/p BSO  . Heart Problems Maternal Aunt   . Diabetes Maternal Aunt   . Heart Problems Paternal Aunt   . Lupus Paternal Aunt   . Breast cancer Paternal Aunt     dx 50s-60s  . Emphysema Paternal Aunt   . Alzheimer's disease Paternal Uncle   . Heart disease Paternal Uncle   . COPD Paternal Uncle   . Heart disease Paternal Uncle   . Cancer Cousin     paternal 1st cousin dx late 68s; NOS cancer    SOCIAL HISTORY:  Social History   Social History  . Marital status: Divorced    Spouse name: N/A  . Number of children: N/A  . Years of education: N/A   Occupational History  . Not on file.  Social History Main Topics  . Smoking status: Never Smoker  . Smokeless tobacco: Never Used  . Alcohol use Yes     Comment: 1 glass wine per day  . Drug use: No  . Sexual activity: Not on file   Other Topics Concern  . Not on file   Social History Narrative  . No narrative on file  The patient lives in [place], [place]. She is a retired Automotive engineer. She comes [facility] care because her sister who had breast cancer received care in the area.  ALLERGIES: Penicillin g  MEDICATIONS:  Current Outpatient Prescriptions  Medication Sig Dispense Refill  . anastrozole (ARIMIDEX) 1 MG tablet Take 1 tablet (1 mg total) by mouth daily. 90 tablet 3  . atorvastatin (LIPITOR) 20 MG tablet TAKE 1 TABLET DAILY    . citalopram (CELEXA) 20 MG tablet TAKE 1  TABLET DAILY    . metoprolol succinate (TOPROL-XL) 25 MG 24 hr tablet TAKE 1 TABLET DAILY     No current facility-administered medications for this encounter.     REVIEW OF SYSTEMS:  On review of systems, the patient reports that she is doing well overall. She has been very stressed by this new diagnosis. She denies any chest pain, shortness of breath, cough, fevers, chills, night sweats, unintended weight changes. She denies any bowel or bladder disturbances, and denies abdominal pain, nausea or vomiting. She denies any new musculoskeletal or joint aches or pains. A complete review of systems is obtained and is otherwise negative.    PHYSICAL EXAM:  height is _0  (1.626 m) and weight is 181 lb (82.1 kg). Her oral temperature is 97.9 F (36.6 C). Her blood pressure is 131/87 and her pulse is 83. Her respiration is 18 and oxygen saturation is 98%.   Pain Scale 0/10 In general this is a well appearing Caucasian female in no acute distress. She's alert and oriented x4 and appropriate throughout the examination. Cardiopulmonary assessment is negative for acute distress and she exhibits normal effort.    KPS = 100  100 - Normal; no complaints; no evidence of disease. 90   - Able to carry on normal activity; minor signs or symptoms of disease. 80   - Normal activity with effort; some signs or symptoms of disease. 43   - Cares for self; unable to carry on normal activity or to do active work. 60   - Requires occasional assistance, but is able to care for most of his personal needs. 50   - Requires considerable assistance and frequent medical care. 30   - Disabled; requires special care and assistance. 59   - Severely disabled; hospital admission is indicated although death not imminent. 76   - Very sick; hospital admission necessary; active supportive treatment necessary. 10   - Moribund; fatal processes progressing rapidly. 0     - Dead  Karnofsky DA, Abelmann WH, Craver LS and Burchenal JH  [phone]) The use of the nitrogen mustards in the palliative treatment of carcinoma: with particular reference to bronchogenic carcinoma Cancer 1 [id number]  LABORATORY DATA:  No results found for: WBC, HGB, HCT, MCV, PLT No results found for: NA, K, CL, CO2 No results found for: ALT, AST, GGT, ALKPHOS, BILITOT   RADIOGRAPHY: No results found.    IMPRESSION/PLAN: 1. Multifocal, ER/PR positive, HER2 negative invasive ductal carcinoma of the right breast. Dr. Tammi Klippel reviews the patient's pathology and MRI scan of the breast. He discusses that clinically although she  has multifocal disease, the largest lesion is smaller than 5cm and due to this, he feels that the patient most likely will not require radiotherapy. However final pathology will determine if there is an indication for post mastectomy radiotherapy which would include large tumors, or positive lymph nodes. He discusses the risks, benefits, short, and long term effects of therapy, and discusses the post reconstructive concerns she has. We will be happy to see her back after completion of surgery and chemotherapy if there becomes an indication for radiation.  In a visit lasting 60 minutes, greater than 50% of time was spent face to face discussing reconstruction related to radiotherapy.  The above documentation reflects my direct findings during this shared patient visit. Please see the separate note by Dr. Tammi Klippel on this date for the remainder of the patient's plan of care.   Carola Rhine, PAC

## 2015-09-28 NOTE — Progress Notes (Signed)
Location of Breast Cancer: upper outer quadrant right breast cancer  Histology per Pathology Report:    Receptor Status: ER(100%), PR (10%), Her2-neu (-), Ki-(30%) Oncotype score 36  Did patient present with symptoms (if so, please note symptoms) or was this found on screening mammography?: screening mammogram  Past/Anticipated interventions by surgeon, if PZW:CHEN breast biopsy, mastectomy recommended by Dr. Lindi Adie  Past/Anticipated interventions by medical oncology, if any: Currently taking anastrozole. Gudena recommends adjuvant chemotherapy following mastectomy with dose dense Adriamycin Cytoxan x4 follwed by Abraxance/taxol weekly x 12.  Lymphedema issues, if any:  no    Pain issues, if any:  no   SAFETY ISSUES:  Prior radiation? no  Pacemaker/ICD? no  Possible current pregnancy?no  Is the patient on methotrexate? 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   [place] 12, never been pregnant, BC 8 years, HRT no  BP 131/87 (BP Location: Left Arm, Patient Position: Sitting, Cuff Size: Normal)   Pulse 83   Temp 97.9 F (36.6 C) (Oral)   Resp 18   Ht '5\' 4"'$  (1.626 m)   Wt 181 lb (82.1 kg)   SpO2 98%   BMI 31.07 kg/m   Joaquim Lai, RN 09/28/2015,9:36 AM

## 2015-09-28 NOTE — Progress Notes (Signed)
See progress note under physician encounter. 

## 2015-09-28 NOTE — Telephone Encounter (Signed)
10/30 Appointment time changed per patient request. Patient has prior appointment scheduled at 11:30PM the 2 PM would be better for her schedule.

## 2015-10-10 NOTE — H&P (Signed)
Subjective:    Patient ID: Stephanie Hinton is a 65 y.o. female.  HPI  Here for follow up discussion of breast reconstruction prior to planned bilateral mastectomy. Presented following screening MMG with asymmetry noted on right. [place] with mass UOQ at 10 o'clock, 7 cm from the nipple, measuring 2.8 x 2.1 x 1.4 cm, with associated pleomorphic microcalcifications. Biopsy IDC with DCIS, ER +/, PR +, HER-2 -. MRI demonstrated multicentric breast cancer 4 masses 1.8 cm, 2.1 cm, 1.2 cm, 0.8 cm total spanning 5.9 x 3.6 x 3.3 cm linear non-mass enhancement extending anteriorly suspicious for DCIS total dimension 5.2 cm no adenopathy. Given this mastectomy recommended. Discussed additional biopsies to define extent of disease but declined.   Oncotype 36, high risk, and will receive adjuvant chemotherapy. Genetics negative. Prior breast bx on left benign.  Current 36B, happy with this.  Wt down 10-15 lb through diet and exercise over last year.   Review of Systems     Objective:   Physical Exam  Cardiovascular: Normal rate, regular rhythm and normal heart sounds.   Pulmonary/Chest: Effort normal and breath sounds normal.  Abdominal:  Midline laparotomy scar infraumbilical, redundant tissue without panniculus, no hernia    No palpable masses, no adenopathy axillae Pseudoptosis bilat  Left with scar transverse over UOQ   SN to nipple R 26.5 L 26 cm BW R 16 L 16 cm Nipple to IMF R 7 L 7 cm  Assessment:     Right Breast ca UOQ and multicentric    Plan:     Plan bilateral mastectomies with immediate expander possible acellular dermis reconstruction. Reviewed incisions, drains, OR length, hospital stay and post procedure limitations. Discussed process of expansion and implant based risks including rupture, MRI surveillance for silicone implants, infection requiring surgery or removal, contracture. Discussed future surgery dependent on adjuvant treatments.  Discussed SSM vs NSM,  reviewed both asensate and both risk mastectomy flap necrosis that could require debridement or removal expander. She has discussed this with Dr. Dalbert Batman who feels tumor locations would still allow for this. Counseled from breast size/ptosis perspective also good candidate for this. Reviewed portfolio pictures of each type and nipple reconstruction examples. She is still considering this and will let Dr. Dalbert Batman know her decisions.   Discussed use of ADM in reconstruction, cadaveric source, incorporation over weeks, risk seroma or infection with this especially if product does not incorporate.  We reviewed that there is a chance post mastectomy radiation may be recommended given extent disease on MRI- we will not know true extent until final pathology available . Reviewed the effect of radiation on reconstruction, including increased risk capsular contracture and wound healing problems. Counseled if radiation is recommended and she has an expander in place, we would complete expansion and I would likely recommend no implant exchange surgery for 6 months post completion of radiation.   Irene Limbo, MD [facility]astic [facility] Surgery [phone], pin [phone]

## 2015-10-13 ENCOUNTER — Telehealth: Payer: Self-pay | Admitting: *Deleted

## 2015-10-13 ENCOUNTER — Other Ambulatory Visit: Payer: Self-pay | Admitting: *Deleted

## 2015-10-13 DIAGNOSIS — C50411 Malignant neoplasm of upper-outer quadrant of right female breast: Secondary | ICD-10-CM

## 2015-10-13 NOTE — Telephone Encounter (Signed)
Informed pt she can have her echo before or after sx on 10/23. Denies further needs or questions at this time

## 2015-10-14 ENCOUNTER — Telehealth: Payer: Self-pay | Admitting: *Deleted

## 2015-10-14 NOTE — Telephone Encounter (Signed)
Discussed flu shot with pt. Denies further needs or questions at this time

## 2015-10-18 ENCOUNTER — Encounter (HOSPITAL_COMMUNITY): Payer: Self-pay

## 2015-10-18 ENCOUNTER — Encounter (HOSPITAL_COMMUNITY)
Admission: RE | Admit: 2015-10-18 | Discharge: 2015-10-18 | Disposition: A | Discharge status: Home / Self Care | Payer: BC Managed Care – PPO | Source: Ambulatory Visit | Attending: General Surgery | Admitting: General Surgery

## 2015-10-18 DIAGNOSIS — Z01818 Encounter for other preprocedural examination: Secondary | ICD-10-CM | POA: Diagnosis not present

## 2015-10-18 DIAGNOSIS — Z01810 Encounter for preprocedural cardiovascular examination: Secondary | ICD-10-CM | POA: Diagnosis not present

## 2015-10-18 DIAGNOSIS — R001 Bradycardia, unspecified: Secondary | ICD-10-CM | POA: Diagnosis not present

## 2015-10-18 DIAGNOSIS — I10 Essential (primary) hypertension: Secondary | ICD-10-CM | POA: Diagnosis not present

## 2015-10-18 DIAGNOSIS — C50911 Malignant neoplasm of unspecified site of right female breast: Secondary | ICD-10-CM | POA: Diagnosis not present

## 2015-10-18 HISTORY — DX: Anxiety disorder, unspecified: F41.9

## 2015-10-18 HISTORY — DX: Essential (primary) hypertension: I10

## 2015-10-18 LAB — CBC WITH DIFFERENTIAL/PLATELET
BASOS PCT: 1 %
Basophils Absolute: 0 10*3/uL (ref 0.0–0.1)
EOS ABS: 0.2 10*3/uL (ref 0.0–0.7)
EOS PCT: 4 %
HCT: 40.8 % (ref 36.0–46.0)
HEMOGLOBIN: 13.8 g/dL (ref 12.0–15.0)
LYMPHS ABS: 1.8 10*3/uL (ref 0.7–4.0)
Lymphocytes Relative: 28 %
MCH: 31.8 pg (ref 26.0–34.0)
MCHC: 33.8 g/dL (ref 30.0–36.0)
MCV: 94 fL (ref 78.0–100.0)
MONO ABS: 0.4 10*3/uL (ref 0.1–1.0)
MONOS PCT: 7 %
NEUTROS PCT: 60 %
Neutro Abs: 3.8 10*3/uL (ref 1.7–7.7)
Platelets: 222 10*3/uL (ref 150–400)
RBC: 4.34 MIL/uL (ref 3.87–5.11)
RDW: 12.1 % (ref 11.5–15.5)
WBC: 6.2 10*3/uL (ref 4.0–10.5)

## 2015-10-18 LAB — COMPREHENSIVE METABOLIC PANEL
ALK PHOS: 61 U/L (ref 38–126)
ALT: 23 U/L (ref 14–54)
ANION GAP: 7 (ref 5–15)
AST: 20 U/L (ref 15–41)
Albumin: 4.2 g/dL (ref 3.5–5.0)
BUN: 12 mg/dL (ref 6–20)
CALCIUM: 9.5 mg/dL (ref 8.9–10.3)
CO2: 26 mmol/L (ref 22–32)
Chloride: 108 mmol/L (ref 101–111)
Creatinine, Ser: 0.71 mg/dL (ref 0.44–1.00)
GFR calc non Af Amer: >60 mL/min (ref >60)
Glucose, Bld: 102 mg/dL — ABNORMAL HIGH (ref 65–99)
POTASSIUM: 4.3 mmol/L (ref 3.5–5.1)
SODIUM: 141 mmol/L (ref 135–145)
TOTAL PROTEIN: 7.6 g/dL (ref 6.5–8.1)
Total Bilirubin: 0.5 mg/dL (ref 0.3–1.2)

## 2015-10-18 NOTE — Pre-Procedure Instructions (Signed)
    Damariah Cudahy  10/18/2015      [facility] [place], [place] [address] ([address] [place] [address] Phone: [phone] Fax: [phone]    Your procedure is scheduled on October 23.  Report to [facility] Admitting at 5:30 A.M.  Call this number if you have problems the morning of surgery:  [phone]   Remember:  Do not eat food or drink liquids after midnight.  Take these medicines the morning of surgery with A SIP OF WATER :ARIMIDEX, CELEXA, metoprolol succinate (TOPROL-XL)  STOP aspirin, NSAIDS(advil, aleve, ibuprofen), herbal medications as of today   Do not wear jewelry, make-up or nail polish.  Do not wear lotions, powders, or perfumes, or deoderant.  Do not shave 48 hours prior to surgery.  Men may shave face and neck.  Do not bring valuables to the hospital.  [facility] is not responsible for any belongings or valuables.  Contacts, dentures or bridgework may not be worn into surgery.  Leave your suitcase in the car.  After surgery it may be brought to your room.  For patients admitted to the hospital, discharge time will be determined by your treatment team.  Patients discharged the day of surgery will not be allowed to drive home.   Name and phone number of your driver:    Special instructions:  Preparing for surgery  Please read over the following fact sheets that you were given.

## 2015-10-21 MED ORDER — CEFAZOLIN SODIUM-DEXTROSE 2-4 GM/100ML-% IV SOLN
2.0000 g | INTRAVENOUS | Status: AC
Start: 2015-10-24 — End: 2015-10-24
  Administered 2015-10-24: 2 g via INTRAVENOUS
  Filled 2015-10-21: qty 100

## 2015-10-23 NOTE — H&P (Signed)
Stephanie Hinton nn Location: [facility] Patient #: [id number] DOB: [birth year] Single / Language: Cleophus Molt / Race: White Female        History of Present Illness The patient is a 65 year old female who presents with breast cancer. This is a 65 year old Caucasian female who returns for her third preop visit regarding management of her multicentric right breast cancer. Dr. Lindi Adie is her oncologist. Dr. Iran Planas is her plastic surgeon. Her PCP is Dr. Orbie Pyo. Her husband and 2 other female family members are here with her. To review, she has a remote history of left breast biopsy for ADH. She gets annual mammograms. Recent mammogram showed a density in the right breast upper lateral quadrant possibly 2.8 cm in greatest dimension and lots of calcifications. The mass was in the posterior third. Ultrasound of the right axilla was negative. 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 This has been presented at tumor board. She has seen Dr. Lindi Adie who started her on anastrozole. Subsequent breast MRI showed that the biopsied mass in the upper outer breast measures 1.8 cm. Just inferior to this mass is a second suspicious irregular mass measuring 2.1 cm. Medial and inferior to the biopsied mass was a third highly suspicious mass measuring 1.2 cm. Just lateral to the smallest medial mass is another satellite in the lesion measuring 8 mm. There is also some non-mass enhancement extending anteriorly, thought to represent possible DCIS. The total maximum extent of disease of all 3 main suspicious masses is 5.9 cm. One of these masses extends to the medial breast so that if this is cancer she has multi quadrant disease. No adenopathy seen on MRI. No abnormality of the left breast.  Genetic testing is negative. Oncotype score is 36, and Dr. Lindi Adie has advised adjuvant chemotherapy  and a Port-A-Cath and the patient accepts this. Patient is already on anastrozole. This will be held during adjuvant chemotherapy and be resumed thereafter.  The patient has decided she wants bilateral mastectomies and immediate reconstruction with tissue expander.. Considering her family history and desires for symmetrical reconstruction, I told her this was reasonable. She is aware that there is no survival benefit from a cancer standpoint. She has seen Dr. Iran Planas They have gone over all of her options. Risk of complications if she has radiation therapy if the tumor is larger than we thought or she has positive nodes. I spent a very long time with the patient and her family going over her options. Basically discussing the difference between skin sparing mastectomy with immediate reconstruction versus nipple sparing mastectomy and the obligatory tissue expander placement. She is still a little ambivalent about this. I told her that nipple sparing mastectomy enjoys a hidden scar technique but reminded her that the nipple would be insensate and losing erectile function and that there was increased risk of vascular compromise. I told her that skin sparing mastectomy enjoys more reliable wound healing but at the expense of the exposed scar and the need for nipple tattoo if she chose. What she wants to do is go ahead and schedule surgery and decide whether she wants nipple sparing mastectomy or skin sparing mastectomy in a few days. I encouraged her to call Dr. Iran Planas. I told her we could change the technique but we needed to know at least a week in advance.  Comorbidities include anxiety on Celexa. Breast biopsy 2 one of which showed ADH.  Lumbar laminectomy. Abdominal hysterectomy and colectomy and 1 ovary removed for endometriosis. She wants the other ovary removed some point.  Family history is significant for a sister that had breast cancer at age 33 and she also  had ovarian cancer. Reportedly that sister had genetic testing 20 years ago which was negative. A paternal aunt had breast cancer in her 25s. Mother living age 24. Father deceased coronary artery disease Social history reveals she is married. Retired Automotive engineer. Lives in [place]. No children. 7 siblings.  She will be scheduled for Port-A-Cath insertion with ultrasound, injection of blue dye right breast, right nipple sparing mastectomy, right axillary sentinel lymph node biopsy, left prophylactic nipple sparing mastectomy I have discussed the indications, details, techniques, and numerous risk of the surgery. She is at risk of bleeding, infection, arm swelling, arm numbness, shoulder disability, skin necrosis, nipple necrosis, nerve damage with chronic pain, pneumothorax, air embolus, malfunction of the port requiring revision at a later date. She understands all these issues. All of her questions are answered. She agrees with this plan.  Allergies  No Known Drug Allergies  Medication History  Anastrozole ('1MG'$  Tablet, Oral) Active. Atorvastatin Calcium ('20MG'$  Tablet, Oral) Active. Citalopram Hydrobromide ('20MG'$  Tablet, Oral) Active. Metoprolol Succinate ER ('25MG'$  Tablet ER 24HR, Oral) Active. Medications Reconciled  Vitals  Weight: 187 lb Height: 64in Body Surface Area: 1.9 m Body Mass Index: 32.1 kg/m  Temp.: 3F(Temporal)  Pulse: 76 (Regular)  BP: 126/80 (Sitting, Left Arm, Standard)   Physical Exam  General Mental Status-Alert. General Appearance-Not in acute distress. Build & Nutrition-Well nourished. Posture-Normal posture. Gait-Normal.  Head and Neck Head-normocephalic, atraumatic with no lesions or palpable masses. Trachea-midline. Thyroid Gland Characteristics - normal size and consistency and no palpable nodules.  Chest and Lung Exam Chest and lung exam reveals -on auscultation, normal breath  sounds, no adventitious sounds and normal vocal resonance.  Breast Note: Both breasts are examined. 36-B cup. There don't seem that large and the distance between the superior and inferior pole doesn't seem that far. Tissues are soft. I don't really feel a mass in either breast. No axillary or cervical adenopathy. Nipple and areolar complexes looked normal   Cardiovascular Cardiovascular examination reveals -normal heart sounds, regular rate and rhythm with no murmurs and femoral artery auscultation bilaterally reveals normal pulses, no bruits, no thrills.  Abdomen Inspection Inspection of the abdomen reveals - No Hernias. Palpation/Percussion Palpation and Percussion of the abdomen reveal - Soft, Non Tender, No Rigidity (guarding), No hepatosplenomegaly and No Palpable abdominal masses. Note: Lower midline scar. No hernia mass or tenderness.   Neurologic Neurologic evaluation reveals -alert and oriented x 3 with no impairment of recent or remote memory, normal attention span and ability to concentrate, normal sensation and normal coordination.  Musculoskeletal Normal Exam - Bilateral-Upper Extremity Strength Normal and Lower Extremity Strength Normal.    Assessment & Plan  PRIMARY CANCER OF UPPER OUTER QUADRANT OF RIGHT FEMALE BREAST (C50.411) .   You have had a consultation with Dr. Lindi Adie He has placed you on anastrozole He has advised postop chemotherapy and a Port-A-Cath insertion We have discussed the indications, details, techniques, and numerous risk for Port-A-Cath insertion.  Your genetic testing is negative  You have had a consultation with Dr. Iran Planas She has discussed the pros and cons and risks of different forms of reconstruction, including immediate versus delayed  You have decided to proceed with bilateral mastectomy, right sentinel lymph node biopsy, immediate reconstruction with tissue expander You are aware  of the possibility of radiation  therapy, but hopefully you will not need that. We have spent a long time discussing the difference between skin sparing mastectomy and nipple sparing mastectomy. We have discussed the indications, techniques, and numerous risk of the surgery We have reviewed your MRI We have talked about the advantages and disadvantages of each technique At this point in time you are still uncertain as to which technique you prefer You have requested that we go ahead and schedule your surgery, and that you will decide in a few days whether you want skin sparing mastectomy with immediate reconstruction or nipple sparing mastectomy with immediate reconstruction. That is fine. Just give me and Dr. Iran Planas a call once you makes your decision  FAMILY HISTORY OF BREAST CANCER IN FIRST DEGREE RELATIVE (Z80.3) HISTORY OF ENDOMETRIOSIS (Z87.42) HISTORY OF TOTAL ABDOMINAL HYSTERECTOMY (Z90.710) ANXIETY, MILD (F41.9) HISTORY OF BACK SURGERY (Z98.890) BMI 31.0-31.9,ADULT (Z68.31)   Edsel Petrin. Dalbert Batman, M.D., [facility] General and Minimally invasive Surgery Breast and Colorectal Surgery Office:   [phone] Pager:   [phone]

## 2015-10-24 ENCOUNTER — Ambulatory Visit (HOSPITAL_COMMUNITY)
Admission: RE | Admit: 2015-10-24 | Discharge: 2015-10-24 | Disposition: A | Discharge status: Home / Self Care | Payer: BC Managed Care – PPO | Source: Ambulatory Visit | Attending: General Surgery | Admitting: General Surgery

## 2015-10-24 ENCOUNTER — Ambulatory Visit (HOSPITAL_COMMUNITY): Payer: BC Managed Care – PPO

## 2015-10-24 ENCOUNTER — Ambulatory Visit (HOSPITAL_COMMUNITY): Payer: BC Managed Care – PPO | Admitting: Certified Registered Nurse Anesthetist

## 2015-10-24 ENCOUNTER — Encounter (HOSPITAL_COMMUNITY): Payer: Self-pay | Admitting: General Practice

## 2015-10-24 ENCOUNTER — Observation Stay (HOSPITAL_COMMUNITY)
Admission: RE | Admit: 2015-10-24 | Discharge: 2015-10-25 | Disposition: A | Discharge status: Home / Self Care | Payer: BC Managed Care – PPO | Source: Ambulatory Visit | Attending: Plastic Surgery | Admitting: Plastic Surgery

## 2015-10-24 ENCOUNTER — Encounter (HOSPITAL_COMMUNITY)
Admission: RE | Disposition: A | Discharge status: Home / Self Care | Payer: Self-pay | Source: Ambulatory Visit | Attending: Plastic Surgery

## 2015-10-24 DIAGNOSIS — Z170 Estrogen receptor positive status [ER+]: Secondary | ICD-10-CM | POA: Insufficient documentation

## 2015-10-24 DIAGNOSIS — Z95828 Presence of other vascular implants and grafts: Secondary | ICD-10-CM

## 2015-10-24 DIAGNOSIS — Z90710 Acquired absence of both cervix and uterus: Secondary | ICD-10-CM | POA: Diagnosis not present

## 2015-10-24 DIAGNOSIS — I10 Essential (primary) hypertension: Secondary | ICD-10-CM | POA: Diagnosis not present

## 2015-10-24 DIAGNOSIS — N6022 Fibroadenosis of left breast: Secondary | ICD-10-CM | POA: Diagnosis not present

## 2015-10-24 DIAGNOSIS — Z8249 Family history of ischemic heart disease and other diseases of the circulatory system: Secondary | ICD-10-CM | POA: Diagnosis not present

## 2015-10-24 DIAGNOSIS — N6012 Diffuse cystic mastopathy of left breast: Secondary | ICD-10-CM | POA: Insufficient documentation

## 2015-10-24 DIAGNOSIS — F419 Anxiety disorder, unspecified: Secondary | ICD-10-CM | POA: Diagnosis not present

## 2015-10-24 DIAGNOSIS — Z8041 Family history of malignant neoplasm of ovary: Secondary | ICD-10-CM | POA: Insufficient documentation

## 2015-10-24 DIAGNOSIS — Z803 Family history of malignant neoplasm of breast: Secondary | ICD-10-CM | POA: Diagnosis not present

## 2015-10-24 DIAGNOSIS — C50411 Malignant neoplasm of upper-outer quadrant of right female breast: Principal | ICD-10-CM | POA: Insufficient documentation

## 2015-10-24 DIAGNOSIS — C50811 Malignant neoplasm of overlapping sites of right female breast: Secondary | ICD-10-CM

## 2015-10-24 DIAGNOSIS — Z4001 Encounter for prophylactic removal of breast: Secondary | ICD-10-CM | POA: Diagnosis not present

## 2015-10-24 DIAGNOSIS — Z419 Encounter for procedure for purposes other than remedying health state, unspecified: Secondary | ICD-10-CM

## 2015-10-24 DIAGNOSIS — C50911 Malignant neoplasm of unspecified site of right female breast: Secondary | ICD-10-CM | POA: Diagnosis present

## 2015-10-24 HISTORY — PX: MASTECTOMY: SHX3

## 2015-10-24 HISTORY — PX: NIPPLE SPARING MASTECTOMY: SHX6537

## 2015-10-24 HISTORY — PX: BREAST RECONSTRUCTION WITH PLACEMENT OF TISSUE EXPANDER AND FLEX HD (ACELLULAR HYDRATED DERMIS): SHX6295

## 2015-10-24 HISTORY — DX: Pure hypercholesterolemia, unspecified: E78.00

## 2015-10-24 HISTORY — PX: MASTECTOMY COMPLETE / SIMPLE W/ SENTINEL NODE BIOPSY: SUR846

## 2015-10-24 HISTORY — PX: PORTACATH PLACEMENT: SHX2246

## 2015-10-24 HISTORY — DX: Malignant neoplasm of unspecified site of right female breast: C50.911

## 2015-10-24 SURGERY — MASTECTOMY, NIPPLE SPARING
Anesthesia: Choice | Site: Chest

## 2015-10-24 MED ORDER — KETOROLAC TROMETHAMINE 30 MG/ML IJ SOLN
30.0000 mg | Freq: Three times a day (TID) | INTRAMUSCULAR | Status: DC
  Administered 2015-10-24 – 2015-10-25 (×2): 30 mg via INTRAVENOUS
  Filled 2015-10-24 (×2): qty 1

## 2015-10-24 MED ORDER — ROCURONIUM BROMIDE 10 MG/ML (PF) SYRINGE
PREFILLED_SYRINGE | INTRAVENOUS | Status: AC
  Filled 2015-10-24: qty 10

## 2015-10-24 MED ORDER — CEFAZOLIN IN D5W 1 GM/50ML IV SOLN
1.0000 g | Freq: Three times a day (TID) | INTRAVENOUS | Status: DC
  Administered 2015-10-24 – 2015-10-25 (×3): 1 g via INTRAVENOUS
  Filled 2015-10-24 (×5): qty 50

## 2015-10-24 MED ORDER — LIDOCAINE 2% (20 MG/ML) 5 ML SYRINGE
INTRAMUSCULAR | Status: DC | PRN
  Administered 2015-10-24: 100 mg via INTRAVENOUS

## 2015-10-24 MED ORDER — ONDANSETRON HCL 4 MG/2ML IJ SOLN: 4.0000 mg | Freq: Once | INTRAMUSCULAR | Status: DC | PRN

## 2015-10-24 MED ORDER — IOPAMIDOL (ISOVUE-300) INJECTION 61%
INTRAVENOUS | Status: AC
  Filled 2015-10-24: qty 50

## 2015-10-24 MED ORDER — FENTANYL CITRATE (PF) 100 MCG/2ML IJ SOLN
INTRAMUSCULAR | Status: AC
  Filled 2015-10-24: qty 2

## 2015-10-24 MED ORDER — SODIUM CHLORIDE 0.9 % IR SOLN
Status: DC | PRN
  Administered 2015-10-24: 1000 mL

## 2015-10-24 MED ORDER — SUGAMMADEX SODIUM 200 MG/2ML IV SOLN
INTRAVENOUS | Status: AC
  Filled 2015-10-24: qty 2

## 2015-10-24 MED ORDER — ATORVASTATIN CALCIUM 20 MG PO TABS
20.0000 mg | ORAL_TABLET | Freq: Every evening | ORAL | Status: DC
  Administered 2015-10-24: 20 mg via ORAL
  Filled 2015-10-24: qty 1

## 2015-10-24 MED ORDER — PHENYLEPHRINE 40 MCG/ML (10ML) SYRINGE FOR IV PUSH (FOR BLOOD PRESSURE SUPPORT)
PREFILLED_SYRINGE | INTRAVENOUS | Status: DC | PRN
  Administered 2015-10-24: 80 ug via INTRAVENOUS
  Administered 2015-10-24: 40 ug via INTRAVENOUS

## 2015-10-24 MED ORDER — LIDOCAINE-EPINEPHRINE (PF) 1 %-1:200000 IJ SOLN
INTRAMUSCULAR | Status: AC
  Filled 2015-10-24: qty 30

## 2015-10-24 MED ORDER — FENTANYL CITRATE (PF) 100 MCG/2ML IJ SOLN
INTRAMUSCULAR | Status: AC
  Filled 2015-10-24: qty 4

## 2015-10-24 MED ORDER — HEPARIN SOD (PORK) LOCK FLUSH 100 UNIT/ML IV SOLN
INTRAVENOUS | Status: AC
  Filled 2015-10-24: qty 5

## 2015-10-24 MED ORDER — CEFAZOLIN SODIUM 1 G IJ SOLR
INTRAMUSCULAR | Status: DC | PRN
  Administered 2015-10-24: 2 g via INTRAMUSCULAR

## 2015-10-24 MED ORDER — ALBUMIN HUMAN 5 % IV SOLN
INTRAVENOUS | Status: DC | PRN
  Administered 2015-10-24: 10:00:00 via INTRAVENOUS

## 2015-10-24 MED ORDER — SODIUM CHLORIDE 0.9 % IJ SOLN
INTRAMUSCULAR | Status: AC
  Filled 2015-10-24: qty 10

## 2015-10-24 MED ORDER — ONDANSETRON HCL 4 MG/2ML IJ SOLN
INTRAMUSCULAR | Status: AC
  Filled 2015-10-24: qty 2

## 2015-10-24 MED ORDER — ACETAMINOPHEN 500 MG PO TABS
1000.0000 mg | ORAL_TABLET | ORAL | Status: AC
  Administered 2015-10-24: 1000 mg via ORAL
  Filled 2015-10-24: qty 2

## 2015-10-24 MED ORDER — ONDANSETRON 4 MG PO TBDP: 4.0000 mg | ORAL_TABLET | Freq: Four times a day (QID) | ORAL | Status: DC | PRN

## 2015-10-24 MED ORDER — OXYCODONE HCL 5 MG PO TABS
5.0000 mg | ORAL_TABLET | ORAL | Status: DC | PRN
  Administered 2015-10-24 – 2015-10-25 (×2): 10 mg via ORAL
  Filled 2015-10-24 (×2): qty 2

## 2015-10-24 MED ORDER — EPHEDRINE SULFATE-NACL 50-0.9 MG/10ML-% IV SOSY
PREFILLED_SYRINGE | INTRAVENOUS | Status: DC | PRN
  Administered 2015-10-24: 5 mg via INTRAVENOUS
  Administered 2015-10-24: 10 mg via INTRAVENOUS
  Administered 2015-10-24: 5 mg via INTRAVENOUS

## 2015-10-24 MED ORDER — MIDAZOLAM HCL 2 MG/2ML IJ SOLN
INTRAMUSCULAR | Status: AC
  Filled 2015-10-24: qty 2

## 2015-10-24 MED ORDER — SUGAMMADEX SODIUM 200 MG/2ML IV SOLN
INTRAVENOUS | Status: DC | PRN
  Administered 2015-10-24: 200 mg via INTRAVENOUS

## 2015-10-24 MED ORDER — MORPHINE SULFATE (PF) 2 MG/ML IV SOLN: 1.0000 mg | INTRAVENOUS | Status: DC | PRN

## 2015-10-24 MED ORDER — ENOXAPARIN SODIUM 40 MG/0.4ML ~~LOC~~ SOLN
40.0000 mg | SUBCUTANEOUS | Status: DC
  Administered 2015-10-25: 40 mg via SUBCUTANEOUS
  Filled 2015-10-24: qty 0.4

## 2015-10-24 MED ORDER — SODIUM CHLORIDE 0.9 % IV SOLN
INTRAVENOUS | Status: DC
  Filled 2015-10-24: qty 1

## 2015-10-24 MED ORDER — SODIUM CHLORIDE 0.9 % IV SOLN
INTRAVENOUS | Status: DC | PRN
  Administered 2015-10-24: 500 mL

## 2015-10-24 MED ORDER — LACTATED RINGERS IV SOLN
INTRAVENOUS | Status: DC | PRN
  Administered 2015-10-24 (×2): via INTRAVENOUS

## 2015-10-24 MED ORDER — PROPOFOL 10 MG/ML IV BOLUS
INTRAVENOUS | Status: AC
  Filled 2015-10-24: qty 40

## 2015-10-24 MED ORDER — ROCURONIUM BROMIDE 10 MG/ML (PF) SYRINGE
PREFILLED_SYRINGE | INTRAVENOUS | Status: DC | PRN
  Administered 2015-10-24 (×2): 10 mg via INTRAVENOUS
  Administered 2015-10-24: 50 mg via INTRAVENOUS
  Administered 2015-10-24 (×4): 20 mg via INTRAVENOUS

## 2015-10-24 MED ORDER — ONDANSETRON HCL 4 MG/2ML IJ SOLN: 4.0000 mg | Freq: Four times a day (QID) | INTRAMUSCULAR | Status: DC | PRN

## 2015-10-24 MED ORDER — TECHNETIUM TC 99M SULFUR COLLOID FILTERED
1.0000 | Freq: Once | INTRAVENOUS | Status: AC | PRN
  Administered 2015-10-24: 1 via INTRADERMAL

## 2015-10-24 MED ORDER — ONDANSETRON HCL 4 MG/2ML IJ SOLN
INTRAMUSCULAR | Status: DC | PRN
  Administered 2015-10-24: 4 mg via INTRAVENOUS

## 2015-10-24 MED ORDER — ARTIFICIAL TEARS OP OINT
TOPICAL_OINTMENT | OPHTHALMIC | Status: AC
  Filled 2015-10-24: qty 3.5

## 2015-10-24 MED ORDER — DEXTROSE 5 % IV SOLN
INTRAVENOUS | Status: DC | PRN
  Administered 2015-10-24: 25 ug/min via INTRAVENOUS

## 2015-10-24 MED ORDER — HEPARIN SOD (PORK) LOCK FLUSH 100 UNIT/ML IV SOLN
INTRAVENOUS | Status: DC | PRN
  Administered 2015-10-24: 500 [IU]

## 2015-10-24 MED ORDER — HYDROMORPHONE 1 MG/ML IV SOLN
INTRAVENOUS | Status: AC
  Filled 2015-10-24: qty 25

## 2015-10-24 MED ORDER — METHOCARBAMOL 500 MG PO TABS
500.0000 mg | ORAL_TABLET | Freq: Three times a day (TID) | ORAL | Status: DC | PRN
  Administered 2015-10-24 – 2015-10-25 (×2): 500 mg via ORAL
  Filled 2015-10-24 (×2): qty 1

## 2015-10-24 MED ORDER — 0.9 % SODIUM CHLORIDE (POUR BTL) OPTIME
TOPICAL | Status: DC | PRN
  Administered 2015-10-24 (×3): 1000 mL

## 2015-10-24 MED ORDER — GABAPENTIN 300 MG PO CAPS
300.0000 mg | ORAL_CAPSULE | ORAL | Status: AC
  Administered 2015-10-24: 300 mg via ORAL
  Filled 2015-10-24: qty 1

## 2015-10-24 MED ORDER — SODIUM CHLORIDE 0.9 % IJ SOLN
INTRAMUSCULAR | Status: DC | PRN
  Administered 2015-10-24: 5 mL via INTRAMUSCULAR

## 2015-10-24 MED ORDER — FENTANYL CITRATE (PF) 100 MCG/2ML IJ SOLN
INTRAMUSCULAR | Status: DC | PRN
  Administered 2015-10-24 (×2): 50 ug via INTRAVENOUS
  Administered 2015-10-24: 100 ug via INTRAVENOUS
  Administered 2015-10-24 (×2): 50 ug via INTRAVENOUS

## 2015-10-24 MED ORDER — HYDROMORPHONE HCL 1 MG/ML IJ SOLN
0.2500 mg | INTRAMUSCULAR | Status: DC | PRN
  Administered 2015-10-24: 1 mg via INTRAVENOUS

## 2015-10-24 MED ORDER — METOPROLOL SUCCINATE ER 25 MG PO TB24: 25.0000 mg | ORAL_TABLET | Freq: Every evening | ORAL | Status: DC

## 2015-10-24 MED ORDER — CITALOPRAM HYDROBROMIDE 20 MG PO TABS
20.0000 mg | ORAL_TABLET | Freq: Every morning | ORAL | Status: DC
  Administered 2015-10-25: 20 mg via ORAL
  Filled 2015-10-24: qty 1

## 2015-10-24 MED ORDER — DIPHENHYDRAMINE HCL 50 MG/ML IJ SOLN: 12.5000 mg | Freq: Four times a day (QID) | INTRAMUSCULAR | Status: DC | PRN

## 2015-10-24 MED ORDER — PROPOFOL 10 MG/ML IV BOLUS
INTRAVENOUS | Status: DC | PRN
  Administered 2015-10-24: 120 mg via INTRAVENOUS

## 2015-10-24 MED ORDER — LIDOCAINE 2% (20 MG/ML) 5 ML SYRINGE
INTRAMUSCULAR | Status: AC
  Filled 2015-10-24: qty 5

## 2015-10-24 MED ORDER — HYDROMORPHONE HCL 2 MG/ML IJ SOLN
INTRAMUSCULAR | Status: AC
  Filled 2015-10-24: qty 1

## 2015-10-24 MED ORDER — MIDAZOLAM HCL 5 MG/5ML IJ SOLN
INTRAMUSCULAR | Status: DC | PRN
  Administered 2015-10-24: 2 mg via INTRAVENOUS

## 2015-10-24 MED ORDER — SODIUM CHLORIDE 0.9 % IV SOLN
INTRAVENOUS | Status: DC | PRN
  Administered 2015-10-24: 1000 mL

## 2015-10-24 MED ORDER — MEPERIDINE HCL 25 MG/ML IJ SOLN: 6.2500 mg | INTRAMUSCULAR | Status: DC | PRN

## 2015-10-24 MED ORDER — METHYLENE BLUE 0.5 % INJ SOLN
INTRAVENOUS | Status: AC
  Filled 2015-10-24: qty 10

## 2015-10-24 MED ORDER — CEFAZOLIN SODIUM 1 G IJ SOLR
INTRAMUSCULAR | Status: AC
Start: 2015-10-24 — End: 2015-10-24
  Filled 2015-10-24: qty 20

## 2015-10-24 MED ORDER — DIPHENHYDRAMINE HCL 12.5 MG/5ML PO ELIX: 12.5000 mg | ORAL_SOLUTION | Freq: Four times a day (QID) | ORAL | Status: DC | PRN

## 2015-10-24 MED ORDER — ANASTROZOLE 1 MG PO TABS
1.0000 mg | ORAL_TABLET | Freq: Every day | ORAL | Status: DC
  Administered 2015-10-25: 1 mg via ORAL
  Filled 2015-10-24: qty 1

## 2015-10-24 MED ORDER — CELECOXIB 200 MG PO CAPS
400.0000 mg | ORAL_CAPSULE | ORAL | Status: AC
  Administered 2015-10-24: 400 mg via ORAL
  Filled 2015-10-24: qty 2

## 2015-10-24 SURGICAL SUPPLY — 113 items
ADH SKN CLS APL DERMABOND .7 (GAUZE/BANDAGES/DRESSINGS) ×4
ALLOGRAFT DERM CORTIV 4CMX16CM (Tissue Mesh) ×2 IMPLANT
ALLOGRAFT DERM CORTIV 4X16 (Tissue Mesh) ×2 IMPLANT
APPLIER CLIP 9.375 MED OPEN (MISCELLANEOUS) ×4
APR CLP MED 9.3 20 MLT OPN (MISCELLANEOUS) ×2
BAG DECANTER FOR FLEXI CONT (MISCELLANEOUS) ×6 IMPLANT
BINDER BREAST LRG (GAUZE/BANDAGES/DRESSINGS) ×2 IMPLANT
BINDER BREAST XLRG (GAUZE/BANDAGES/DRESSINGS) IMPLANT
BLADE SURG 11 STRL SS (BLADE) ×4 IMPLANT
BLADE SURG 15 STRL LF DISP TIS (BLADE) ×2 IMPLANT
BLADE SURG 15 STRL SS (BLADE) ×4
CANISTER SUCTION 2500CC (MISCELLANEOUS) ×8 IMPLANT
CHLORAPREP W/TINT 26ML (MISCELLANEOUS) ×10 IMPLANT
CLIP APPLIE 9.375 MED OPEN (MISCELLANEOUS) ×2 IMPLANT
CLOSURE STERI-STRIP 1/2X4 (GAUZE/BANDAGES/DRESSINGS)
CLSR STERI-STRIP ANTIMIC 1/2X4 (GAUZE/BANDAGES/DRESSINGS) ×2 IMPLANT
CONT SPEC 4OZ CLIKSEAL STRL BL (MISCELLANEOUS) ×2 IMPLANT
COVER PROBE W GEL 5X96 (DRAPES) ×4 IMPLANT
COVER SURGICAL LIGHT HANDLE (MISCELLANEOUS) ×6 IMPLANT
CRADLE DONUT ADULT HEAD (MISCELLANEOUS) ×4 IMPLANT
DERMABOND ADVANCED (GAUZE/BANDAGES/DRESSINGS) ×4
DERMABOND ADVANCED .7 DNX12 (GAUZE/BANDAGES/DRESSINGS) ×6 IMPLANT
DEVICE DISSECT PLASMABLAD 3.0S (MISCELLANEOUS) IMPLANT
DRAIN CHANNEL 15F RND FF W/TCR (WOUND CARE) IMPLANT
DRAIN CHANNEL 19F RND (DRAIN) ×4 IMPLANT
DRAPE C-ARM 42X72 X-RAY (DRAPES) ×4 IMPLANT
DRAPE INCISE IOBAN 66X45 STRL (DRAPES) IMPLANT
DRAPE LAPAROSCOPIC ABDOMINAL (DRAPES) ×4 IMPLANT
DRAPE ORTHO SPLIT 77X108 STRL (DRAPES) ×8
DRAPE PROXIMA HALF (DRAPES) ×4 IMPLANT
DRAPE SURG ORHT 6 SPLT 77X108 (DRAPES) ×4 IMPLANT
DRAPE UTILITY XL STRL (DRAPES) ×8 IMPLANT
DRAPE WARM FLUID 44X44 (DRAPE) ×4 IMPLANT
DRSG PAD ABDOMINAL 8X10 ST (GAUZE/BANDAGES/DRESSINGS) ×6 IMPLANT
DRSG TEGADERM 2-3/8X2-3/4 SM (GAUZE/BANDAGES/DRESSINGS) ×2 IMPLANT
DRSG TEGADERM 4X4.75 (GAUZE/BANDAGES/DRESSINGS) ×8 IMPLANT
ELECT BLADE 4.0 EZ CLEAN MEGAD (MISCELLANEOUS) ×4
ELECT CAUTERY BLADE 6.4 (BLADE) ×6 IMPLANT
ELECT COATED BLADE 2.86 ST (ELECTRODE) ×4 IMPLANT
ELECT REM PT RETURN 9FT ADLT (ELECTROSURGICAL) ×4
ELECTRODE BLDE 4.0 EZ CLN MEGD (MISCELLANEOUS) ×4 IMPLANT
ELECTRODE REM PT RTRN 9FT ADLT (ELECTROSURGICAL) ×2 IMPLANT
EVACUATOR SILICONE 100CC (DRAIN) ×4 IMPLANT
EXPANDER TISSUE MX 400CC (Breast) IMPLANT
GAUZE SPONGE 4X4 12PLY STRL (GAUZE/BANDAGES/DRESSINGS) ×2 IMPLANT
GAUZE SPONGE 4X4 16PLY XRAY LF (GAUZE/BANDAGES/DRESSINGS) ×4 IMPLANT
GLOVE BIO SURGEON STRL SZ 6 (GLOVE) ×12 IMPLANT
GLOVE BIOGEL PI IND STRL 6.5 (GLOVE) IMPLANT
GLOVE BIOGEL PI IND STRL 7.0 (GLOVE) IMPLANT
GLOVE BIOGEL PI INDICATOR 6.5 (GLOVE) ×4
GLOVE BIOGEL PI INDICATOR 7.0 (GLOVE) ×4
GLOVE EUDERMIC 7 POWDERFREE (GLOVE) ×8 IMPLANT
GLOVE SURG SS PI 6.5 STRL IVOR (GLOVE) ×6 IMPLANT
GLOVE SURG SS PI 7.0 STRL IVOR (GLOVE) ×4 IMPLANT
GOWN STRL REUS W/ TWL LRG LVL3 (GOWN DISPOSABLE) ×4 IMPLANT
GOWN STRL REUS W/ TWL XL LVL3 (GOWN DISPOSABLE) ×2 IMPLANT
GOWN STRL REUS W/TWL LRG LVL3 (GOWN DISPOSABLE) ×20
GOWN STRL REUS W/TWL XL LVL3 (GOWN DISPOSABLE) ×8
ILLUMINATOR WAVEGUIDE N/F (MISCELLANEOUS) IMPLANT
KIT BASIN OR (CUSTOM PROCEDURE TRAY) ×6 IMPLANT
KIT MARKER MARGIN INK (KITS) ×2 IMPLANT
KIT PORT POWER 8FR ISP CVUE (Catheter) ×2 IMPLANT
KIT ROOM TURNOVER OR (KITS) ×8 IMPLANT
LIGHT WAVEGUIDE WIDE FLAT (MISCELLANEOUS) ×2 IMPLANT
MARKER SKIN DUAL TIP RULER LAB (MISCELLANEOUS) ×4 IMPLANT
NDL 18GX1X1/2 (RX/OR ONLY) (NEEDLE) IMPLANT
NDL BLUNT 16X1.5 OR ONLY (NEEDLE) IMPLANT
NDL HYPO 25GX1X1/2 BEV (NEEDLE) ×4 IMPLANT
NEEDLE 18GX1X1/2 (RX/OR ONLY) (NEEDLE) ×4 IMPLANT
NEEDLE BLUNT 16X1.5 OR ONLY (NEEDLE) IMPLANT
NEEDLE HYPO 25GX1X1/2 BEV (NEEDLE) ×8 IMPLANT
NS IRRIG 1000ML POUR BTL (IV SOLUTION) ×12 IMPLANT
PACK GENERAL/GYN (CUSTOM PROCEDURE TRAY) ×6 IMPLANT
PACK SURGICAL SETUP 50X90 (CUSTOM PROCEDURE TRAY) ×4 IMPLANT
PAD ABD 8X10 STRL (GAUZE/BANDAGES/DRESSINGS) ×6 IMPLANT
PAD ARMBOARD 7.5X6 YLW CONV (MISCELLANEOUS) ×8 IMPLANT
PENCIL BUTTON HOLSTER BLD 10FT (ELECTRODE) ×6 IMPLANT
PIN SAFETY STERILE (MISCELLANEOUS) ×2 IMPLANT
PLASMABLADE 3.0S (MISCELLANEOUS)
SET ASEPTIC TRANSFER (MISCELLANEOUS) ×6 IMPLANT
SET COLLECT BLD 21X3/4 12 (NEEDLE) ×2 IMPLANT
SOLUTION BETADINE 4OZ (MISCELLANEOUS) ×4 IMPLANT
SPECIMEN JAR X LARGE (MISCELLANEOUS) ×6 IMPLANT
SPONGE LAP 18X18 X RAY DECT (DISPOSABLE) ×12 IMPLANT
STAPLER VISISTAT 35W (STAPLE) ×4 IMPLANT
SUT ETHILON 2 0 FS 18 (SUTURE) ×4 IMPLANT
SUT ETHILON 3 0 FSL (SUTURE) IMPLANT
SUT MNCRL 3 0 RB1 (SUTURE) IMPLANT
SUT MNCRL AB 4-0 PS2 18 (SUTURE) ×10 IMPLANT
SUT MON AB 5-0 PS2 18 (SUTURE) IMPLANT
SUT MONOCRYL 3 0 RB1 (SUTURE)
SUT PROLENE 2 0 CT2 30 (SUTURE) ×4 IMPLANT
SUT SILK 2 0 FS (SUTURE) IMPLANT
SUT SILK 2 0 SH (SUTURE) ×8 IMPLANT
SUT VIC AB 0 CT1 27 (SUTURE) ×12
SUT VIC AB 0 CT1 27XBRD ANBCTR (SUTURE) IMPLANT
SUT VIC AB 3-0 SH 18 (SUTURE) ×4 IMPLANT
SUT VIC AB 3-0 SH 27 (SUTURE) ×24
SUT VIC AB 3-0 SH 27X BRD (SUTURE) IMPLANT
SUT VIC AB 4-0 PS2 27 (SUTURE) ×4 IMPLANT
SUT VICRYL 3 0 (SUTURE) IMPLANT
SUT VICRYL 4-0 PS2 18IN ABS (SUTURE) IMPLANT
SYR 5ML LUER SLIP (SYRINGE) ×4 IMPLANT
SYR BULB IRRIGATION 50ML (SYRINGE) ×4 IMPLANT
SYR CONTROL 10ML LL (SYRINGE) ×4 IMPLANT
SYRINGE 10CC LL (SYRINGE) ×8 IMPLANT
TAPE STRIPS DRAPE STRL (GAUZE/BANDAGES/DRESSINGS) ×2 IMPLANT
TISSUE EXPANDER MX 400CC (Breast) ×8 IMPLANT
TOWEL OR 17X24 6PK STRL BLUE (TOWEL DISPOSABLE) ×8 IMPLANT
TOWEL OR 17X26 10 PK STRL BLUE (TOWEL DISPOSABLE) ×8 IMPLANT
TRAY FOLEY CATH 16FRSI W/METER (SET/KITS/TRAYS/PACK) ×2 IMPLANT
TUBE CONNECTING 12'X1/4 (SUCTIONS)
TUBE CONNECTING 12X1/4 (SUCTIONS) ×2 IMPLANT

## 2015-10-24 NOTE — Anesthesia Preprocedure Evaluation (Signed)
Anesthesia Evaluation  Patient identified by MRN, date of birth, ID band Patient awake    Reviewed: Allergy & Precautions, NPO status , Patient's Chart, lab work & pertinent test results  Airway Mallampati: I  TM Distance: >3 FB Neck ROM: Full    Dental   Pulmonary    Pulmonary exam normal        Cardiovascular hypertension, Pt. on medications Normal cardiovascular exam     Neuro/Psych Anxiety    GI/Hepatic   Endo/Other    Renal/GU      Musculoskeletal   Abdominal   Peds  Hematology   Anesthesia Other Findings   Reproductive/Obstetrics                             Anesthesia Physical Anesthesia Plan  ASA: II  Anesthesia Plan: General   Post-op Pain Management:  Regional for Post-op pain   Induction: Intravenous  Airway Management Planned: Oral ETT  Additional Equipment:   Intra-op Plan:   Post-operative Plan: Extubation in OR  Informed Consent: I have reviewed the patients History and Physical, chart, labs and discussed the procedure including the risks, benefits and alternatives for the proposed anesthesia with the patient or authorized representative who has indicated his/her understanding and acceptance.     Plan Discussed with: CRNA and Surgeon  Anesthesia Plan Comments:         Anesthesia Quick Evaluation

## 2015-10-24 NOTE — Interval H&P Note (Signed)
History and Physical Interval Note:  10/24/2015 6:36 AM  Mort Sawyers  has presented today for surgery, with the diagnosis of RIGHT BREAST CANCER  The various methods of treatment have been discussed with the patient and family. After consideration of risks, benefits and other options for treatment, the patient has consented to  Procedure(s): RIGHT NIPPLE SPARING MASTECTOMY WITH RIGHT SENTINEL LYMPH NODE BIOPSY, LEFT PROPHYLATIC NIPPLE SPARING MASTECTOMY (Bilateral) INSERTION PORT-A-CATH WITH ULTRASOUND (N/A) BREAST RECONSTRUCTION WITH PLACEMENT OF TISSUE EXPANDER AND POSSIBLE CORTIVA (Bilateral) as a surgical intervention .  The patient's history has been reviewed, patient examined, no change in status, stable for surgery.  I have reviewed the patient's chart and labs.  Questions were answered to the patient's satisfaction.      Patient states she is strongly considering changing to skin sparing mastectomy and will make decision the morning of surgery.  She has communicated this to our office last week.  Adin Hector

## 2015-10-24 NOTE — Op Note (Signed)
Patient Name:           Stephanie Hinton   Date of Surgery:        10/24/2015  Pre op Diagnosis:      Multifocal cancer right breast  Post op Diagnosis:    Same  Procedure:                 Insertion of power point ClearVue 8 French tunneled venous vascular access device                                      Use of fluoroscopy for guidance and positioning                                      Inject blue dye right breast                                      Right skin sparing mastectomy                                      Right axillary sentinel lymph node mapping and biopsy                                      Left prophylactic skin sparing mastectomy  Surgeon:                     Edsel Petrin. Dalbert Batman, M.D., FACS  Assistant:                      Irene Limbo, M.D.   Indication for Assistant: Need for complex exposure of breast, axilla, and skin flap.  Assistant indicated to reduce incidence of intraoperative and postop complications  Operative Indications:    This is a 65 year old Caucasian female who returns for her third preop visit regarding management of her multicentric right breast cancer. Dr. Lindi Adie is her oncologist. Dr. Iran Planas is her plastic surgeon. Her PCP is Dr. Orbie Pyo. . To review, she has a remote history of left breast biopsy for ADH. She gets annual mammograms. Recent mammogram showed a density in the right breast upper lateral quadrant possibly 2.8 cm in greatest dimension and lots of calcifications. The mass was in the posterior third. Ultrasound of the right axilla was negative. 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 This has been presented at tumor board. She has seen Dr. Lindi Adie who started her on anastrozole. Subsequent breast MRI showed that the biopsied mass in the upper outer breast measures 1.8 cm. Just inferior to this mass is a second  suspicious irregular mass measuring 2.1 cm. Medial and inferior to the biopsied mass was a third highly suspicious mass measuring 1.2 cm. Just lateral to the smallest medial mass is another satellite in the lesion measuring 8 mm. There is also some non-mass enhancement extending anteriorly, thought to represent possible DCIS. The total maximum extent of disease of all 3 main suspicious masses is 5.9 cm. One of  these masses extends to the medial breast so that if this is cancer she has multi quadrant disease. No adenopathy seen on MRI. No abnormality of the left breast.      Genetic testing is negative.       Oncotype score is 36, and Dr. Lindi Adie has advised adjuvant chemotherapy and a Port-A-Cath and the patient accepts this. Patient is already on anastrozole. This will be held during adjuvant chemotherapy and be resumed thereafter.    The patient has decided she wants bilateral mastectomies and immediate reconstruction with tissue expander.. Considering her family history and desires for symmetrical reconstruction, I told her this was reasonable. She is aware that there is no survival benefit from a cancer standpoint. She has seen Dr. Iran Planas They have gone over all of her options. Risk of complications if she has radiation therapy if the tumor is larger than we thought or she has positive nodes. I spent a very long time with the patient and her family going over her options. We had a lengthy discussion and it has taken some time for her to decide between nipple sparing mastectomy with immediate tissue expander versus skin sparing mastectomy with immediate expander.  She is finally decided to have skin sparing mastectomies.     Comorbidities include anxiety on Celexa. Breast biopsy 2 one of which showed ADH. Lumbar laminectomy. Abdominal hysterectomy and colectomy and 1 ovary removed for endometriosis. She wants the other ovary removed some point.  Family history is  significant for a sister that had breast cancer at age 53 and she also had ovarian cancer. Reportedly that sister had genetic testing 20 years ago which was negative. A paternal aunt had breast cancer in her 73s. Mother living age 83. Father deceased coronary artery disease Social history reveals she is married. Retired Automotive engineer. Lives in [place]. No children. 7 siblings.      She will be scheduled for Port-A-Cath insertion with ultrasound, injection of blue dye right breast, right total mastectomy, right axillary sentinel lymph node biopsy, left prophylactic total mastectomy I have discussed the indications, details, techniques, and numerous risk of the surgery. She is at risk of bleeding, infection, arm swelling, arm numbness, shoulder disability, skin necrosis, , nerve damage with chronic pain, pneumothorax, air embolus, malfunction of the port requiring revision at a later date. She understands all these issues. All of her questions are answered. She agrees with this plan.  Operative Findings:      Bilateral pectoral blocks were placed by the anesthesiologist in the holding area.  Technetium 99 radionuclide was injected into the right breast by the medicine technician in the holding area.  The Port-A-Cath was placed through the left subclavian vein without difficulty.  There was no gross evidence of cancer in either breast.  I found 3 sentinel lymph nodes in the right axilla.  Dr. Iran Planas will dictate the subpectoral tissue expander placement in a separate dictation  Procedure in Detail:          Following the induction of general endotracheal anesthesia a Foley catheter was placed and intravenous antibiotics were given.  Surgical timeout was performed.  The patient was positioned with a roll behind her shoulders and arms tucked at her sides.  The neck and chest were prepped and draped in a sterile fashion.  A left subclavian venipuncture was performed.  Blood  return was obtained with a single pass and the guidewire threaded easily.  Fluoroscopy confirmed that the guidewire was in the superior vena  cava at the right atrial junction.  A small incision was made at the wire insertion site.  Transverse incision was made below the midpoint of the left clavicle.  A subcutaneous pocket was created at the level of the pectoralis fascia.  Using a tunneling device I passed the catheter from the port pocket site to the wire insertion site.  Using fluoroscopy I measured a template on the chest wall so that the catheter length could be measured and the tip of the catheter would be in the superior vena cava near the right atrium.  Using this template I measured and cut the catheter 23 cm in length.  The catheter was secured to the port with the locking device and the port and catheter flushed with heparinized saline.  The port was sutured to the pectoralis fascia with 3 interrupted sutures of 2-0 Prolene.  The dilator and peel-away sheath were inserted over the guidewire into the central venous circulation.  The dilator and wire were removed, the catheter threaded easily and the peel-away sheath removed.  The catheter flushed easily and had excellent blood return.  Fluoroscopy confirmed that the catheter tip was in the superior vena cava near the right atrium and there is no deformity of the catheter anywhere.  I flushed the port and catheter with concentrated heparin.  The subcutaneous tissue was closed with 3-0 Vicryl and skin closed with a particular 4-0 Monocryl and Dermabond.     The patient was then repositioned.  The roll was removed and the arms were brought out to the sides.  The neck and chest and upper abdomen, axilla and upper arms were prepped and draped in a sterile fashion.  Another surgical timeout was performed.     We marked the anatomic boundaries of the breast, midline and inframammary crease.  We drew a short transverse elliptical skin sparing mastectomy  incisions.  I will dictate the procedure once since the mastectomy part of the procedure was essentially identical on each side.     Bilateral transverse skin sparing mastectomy incisions were made with a knife.  Skin flaps were raised medially to the parasternal area, superiorly to the infraclavicular area, inferiorly to the anterior rectus sheath, and laterally to latissimus dorsi muscle.  On each side the breast was dissected off the pectoralis major and minor muscle.  On each side the lateral skin margin was marked with a silk suture and the specimen was sent separately to the lab.  We continued the dissection up into the right axilla using the neoprobe and found 3 sentinel lymph nodes.  They had very strong radioactivity but minimal blue color.  After these 3 lymph nodes were removed there was no more radioactivity and we completed the dissection at that point.  The wounds were irrigated with saline.  Hemostasis was excellent and had been achieved  with electrocautery and some metal clips.  Estimated blood loss to this point had been about 100 - 150 mL.  Counts correct.  Complications none.       At this point in the case to control the operative procedure was turned over to Dr. Iran Planas will dictate her reconstruction procedure separately.         A chest x-ray is planned for the recovery room.      Edsel Petrin. Dalbert Batman, M.D., FACS General and Minimally Invasive Surgery Breast and Colorectal Surgery  10/24/2015 10:32 AM

## 2015-10-24 NOTE — Op Note (Signed)
Operative Note   DATE OF OPERATION: 10.23.17  LOCATION: [facility]-observ[facility]  SURGICAL DIVISION: Plastic Surgery  PREOPERATIVE DIAGNOSES:  1. Multifocal right breast cancer 2. Family history breast cancer  POSTOPERATIVE DIAGNOSES:  same  PROCEDURE:  1. Bilateral breast reconstruction with tissue expanders 2. Acellular dermis (Cortiva) for breast reconstruction 120 cm2  SURGEON: Irene Limbo MD MBA  ASSISTANT: Harrel Lemon, RN  ANESTHESIA:  General.   EBL: 175 ml for entire procedure  COMPLICATIONS: None immediate.   INDICATIONS FOR PROCEDURE:  The patient, Stephanie Hinton, is a 65 y.o. female born on 22-Mar-1950, is here for immediate expander based reconstruction following skin sparing mastectomies.   FINDINGS: Natrelle 133 MX-12-T 400 ml tissue expanders placed bilateral, initial fill volume 200 ml bilateral. RIGHT SN [id number] LEFT SN [id number]  DESCRIPTION OF PROCEDURE:  The patient was marked to mark sternal notch, chest midline, anterior axillary lines and inframammary folds. The patient was taken to the operating room. SCDs were placed and IV antibiotics were given. The patient's operative site was prepped and draped in a sterile fashion. A time out was performed and all information was confirmed to be correct. Following completion mastectomies, reconstruction began on left side. The inferior insertions of pectoralis major muscle were divided with sternal insertions left intact. Submuscular dissection completed toward clavicle. Acellular dermis was perforated and sewn to inferior border of pectoralis major with running 3-0 vicryl. A 19 Fr drain was placed and secured to skin with 2-0 nylon. The cavity was irrigated with solution containing Ancef, genatmicin, and bacitracin. Hemostasis was ensured. The tissue expander was prepared and placed in submuscular position. The expander was secured to chest wall with a 3-0 vicryl. The inferior border of the acellular dermis was  inset to chest wall and inframammary fold fascia in a 3 point suture fashion.Laterally the acellular dermis was draped over the lateral border of expander and secured to serratus fascia. 0- vicryl suture was used to advance the posterior axillary line anteriorly and secured to serratus and acellular dermis. The axillary soft tissue of mastectomy flap was advanced inferiorly and secured to pectoralis muscle with 0-vicryl. The incision was closed with 3-0 vicryl in fascial layer and 4-0 vicryl in dermis. Skin closure completed with 4-0 monocryl subcuticular and tissue adhesive. Over right breast, similarly the inferior pectoralis insertions were divided. The acellular dermis was prepared and secured to inferior border of pectoralis with 3-0 vicryl. 19 Fr drain placed in cavity prior to placement of expander. The inferior borders of acellular dermis secured to chest wall and inframammary fold fascia to rest the IMF. Laterally the acellular dermis was again wrapped over the lateral border of expander and secured to serratus fascia with 3-0 vicryl. Closure completed in similar fashion.The ports were accessed and filled to 200 ml bilaterally. Tissue adhesive applied. Dry dressing and breast binder applied.  The patient was allowed to wake from anesthesia, extubated and taken to the recovery room in satisfactory condition.   SPECIMENS: none  DRAINS: 19 Fr JP in right and left breast reconstruction  Irene Limbo, MD [facility] Plastic & Reconstructive Surgery [phone], pin [phone]

## 2015-10-24 NOTE — Anesthesia Procedure Notes (Addendum)
Anesthesia Regional Block:  Pectoralis block  Pre-Anesthetic Checklist: ,, timeout performed, Correct Patient, Correct Site, Correct Laterality, Correct Procedure, Correct Position, site marked, Risks and benefits discussed,  Surgical consent,  Pre-op evaluation,  At surgeon's request and post-op pain management  Laterality: Left and Right  Prep: chloraprep       Needles:  Injection technique: Single-shot     Needle Length: 9cm 9 cm Needle Gauge: 21 and 21 G    Additional Needles:  Procedures: ultrasound guided (picture in chart) Pectoralis block Narrative:  Start time: 10/24/2015 7:10 AM End time: 10/24/2015 7:20 AM  Performed by: Personally  Anesthesiologist: Lillia Abed  Additional Notes: Monitors applied. Patient sedated.Bilateral blocks performed sequentially. Sterile prep and drape,hand hygiene and sterile gloves were used. Relevant anatomy identified.Needle position confirmed.Local anesthetic injected incrementally after negative aspiration. Local anesthetic spread visualized. Vascular puncture avoided. No complications. Image printed for medical record.The patient tolerated the procedure well.  Both sides successfully blocked. See picture documentation.

## 2015-10-24 NOTE — Transfer of Care (Signed)
Immediate Anesthesia Transfer of Care Note  Patient: Stephanie Hinton  Procedure(s) Performed: Procedure(s): RIGHT MASTECTOMY WITH RIGHT AXILLARY SENTINEL LYMPH NODE BIOPSY. LEFT PROPHYLATIC MASTECTOMY. (Bilateral) INSERTION PORT-A-CATH LEFT SUBCLAVIAN (N/A) BREAST RECONSTRUCTION WITH PLACEMENT OF TISSUE EXPANDER AND CORTIVA (Bilateral)  Patient Location: PACU  Anesthesia Type:GA combined with regional for post-op pain  Level of Consciousness: awake, alert , oriented and patient cooperative  Airway & Oxygen Therapy: Patient Spontanous Breathing and Patient connected to nasal cannula oxygen  Post-op Assessment: Report given to RN and Post -op Vital signs reviewed and stable  Post vital signs: Reviewed and stable  Last Vitals:  Vitals:   10/24/15 0616  BP: 137/61  Pulse: (!) 59  Resp: 18  Temp: 36.8 C    Last Pain:  Vitals:   10/24/15 0616  TempSrc: Oral         Complications: No apparent anesthesia complications

## 2015-10-24 NOTE — Anesthesia Procedure Notes (Signed)
Procedure Name: Intubation Date/Time: 10/24/2015 7:42 AM Performed by: Everlean Cherry A Pre-anesthesia Checklist: Patient identified, Emergency Drugs available, Suction available and Patient being monitored Patient Re-evaluated:Patient Re-evaluated prior to inductionOxygen Delivery Method: Circle system utilized Preoxygenation: Pre-oxygenation with 100% oxygen Intubation Type: IV induction Ventilation: Mask ventilation without difficulty and Oral airway inserted - appropriate to patient size Laryngoscope Size: 2 and Miller Grade View: Grade II Tube type: Oral Tube size: 7.0 mm Number of attempts: 1 Airway Equipment and Method: Stylet Placement Confirmation: ETT inserted through vocal cords under direct vision,  positive ETCO2 and breath sounds checked- equal and bilateral Secured at: 23 cm Tube secured with: Tape Dental Injury: Teeth and Oropharynx as per pre-operative assessment

## 2015-10-24 NOTE — Progress Notes (Signed)
Received from PACU, oriented to room and surroundings, denies nausea/pain at this time, family at bedside

## 2015-10-24 NOTE — Interval H&P Note (Signed)
History and Physical Interval Note:  10/24/2015 7:01 AM  Stephanie Hinton  has presented today for surgery, with the diagnosis of RIGHT BREAST CANCER  The various methods of treatment have been discussed with the patient and family. After consideration of risks, benefits and other options for treatment, the patient has consented to  Bilateral skin sparing mastectomies, placement port, right sentinel node, bilateral breast reconstruction with tissue expander possible acellular dermis as a surgical intervention .  The patient's history has been reviewed, patient examined, no change in status, stable for surgery.  I have reviewed the patient's chart and labs.  Questions were answered to the patient's satisfaction.     Stephanie Hinton

## 2015-10-25 ENCOUNTER — Encounter (HOSPITAL_COMMUNITY): Payer: Self-pay | Admitting: General Surgery

## 2015-10-25 DIAGNOSIS — C50411 Malignant neoplasm of upper-outer quadrant of right female breast: Secondary | ICD-10-CM | POA: Diagnosis not present

## 2015-10-25 MED ORDER — DOXYCYCLINE HYCLATE 50 MG PO CAPS: 50.0000 mg | ORAL_CAPSULE | Freq: Two times a day (BID) | ORAL | 0 refills | Status: DC

## 2015-10-25 MED ORDER — METHOCARBAMOL 500 MG PO TABS: 500.0000 mg | ORAL_TABLET | Freq: Three times a day (TID) | ORAL | 0 refills | Status: DC | PRN

## 2015-10-25 MED ORDER — OXYCODONE HCL 5 MG PO TABS: 5.0000 mg | ORAL_TABLET | ORAL | 0 refills | Status: DC | PRN

## 2015-10-25 NOTE — Anesthesia Postprocedure Evaluation (Signed)
Anesthesia Post Note  Patient: Stephanie Hinton  Procedure(s) Performed: Procedure(s) (LRB): RIGHT MASTECTOMY WITH RIGHT AXILLARY SENTINEL LYMPH NODE BIOPSY. LEFT PROPHYLATIC MASTECTOMY. (Bilateral) INSERTION PORT-A-CATH LEFT SUBCLAVIAN (N/A) BREAST RECONSTRUCTION WITH PLACEMENT OF TISSUE EXPANDER AND CORTIVA (Bilateral)  Patient location during evaluation: PACU Anesthesia Type: General Level of consciousness: awake and alert Pain management: pain level controlled Vital Signs Assessment: post-procedure vital signs reviewed and stable Respiratory status: spontaneous breathing, nonlabored ventilation, respiratory function stable and patient connected to nasal cannula oxygen Cardiovascular status: blood pressure returned to baseline and stable Postop Assessment: no signs of nausea or vomiting Anesthetic complications: no    Last Vitals:  Vitals:   10/25/15 0205 10/25/15 0614  BP: 96/61 103/63  Pulse: 60 63  Resp: 16 16  Temp: 36.8 C 36.8 C    Last Pain:  Vitals:   10/25/15 1040  TempSrc:   PainSc: 1                  Ryland Smoots DAVID

## 2015-10-25 NOTE — Discharge Instructions (Signed)
Dr. Dalbert Batman will call the pathology report to you by Thursday of this week Keep your appointment with Dr. Lindi Adie next week Dr. Iran Planas will instruct you on wound and drain care.

## 2015-10-25 NOTE — Progress Notes (Signed)
Patient discharged to home with instructions given and demontrated to family members, prescriptions also given to patient.

## 2015-10-25 NOTE — Discharge Summary (Signed)
Physician Discharge Summary  Patient ID: Stephanie Hinton MRN: [id number] DOB/AGE: 63-Oct-1952 63 y.o.  Admit date: 10/24/2015 Discharge date: 10/25/2015  Admission Diagnoses: Right breast cancer  Discharge Diagnoses:  Active Problems:   Breast cancer, right [facility])   Discharged Condition: stable  Hospital Course: Patient did well post operatively and ambulated with minimal assist on POD#0. She tolerated diet and oral medication for pain. She was instructed on drain care and medications.  Treatments: surgery: bilateral skin sparing mastectomies, right sentinel node, bilateral tissue expander and acellular dermis reconstruction  Discharge Exam: Blood pressure 103/63, pulse 63, temperature 98.2 F (36.8 C), temperature source Oral, resp. rate 16, height 5\' 4"  (1.626 m), weight 81.6 kg (179 lb 15 oz), SpO2 99 %. Incision/Wound: chest flat, incisions dry intact, JPs serosangionous  Disposition: Home  Discharge Instructions    Call MD for:  redness, tenderness, or signs of infection (pain, swelling, bleeding, redness, odor or green/yellow discharge around incision site)    Complete by:  As directed    Call MD for:  temperature >100.5    Complete by:  As directed    Discharge instructions    Complete by:  As directed    Ok to remove dressings and shower am 10.25.17. Soap and water ok, pat incisions dry. No creams or ointments over incisions. Do not let drains dangle in shower, attach to lanyard or similar.Strip and record drains twice daily and bring log to clinic visit.  Breast binder or soft compression bra all other times.  Ok to raise arms above shoulders for bathing and dressing.  No house yard work or exercise until cleared by MD.   Reino Bellis Miralax for constipation. Recommend ibuprofen 400 mg with meals once eating well. Also ok to take Tylenol for pain.   Driving Restrictions    Complete by:  As directed    No driving while taking narcotics   Lifting restrictions     Complete by:  As directed    No lifting > 5 lbs   Resume previous diet    Complete by:  As directed        Medication List    TAKE these medications   anastrozole 1 MG tablet Commonly known as:  ARIMIDEX Take 1 tablet (1 mg total) by mouth daily. What changed:  when to take this   atorvastatin 20 MG tablet Commonly known as:  LIPITOR TAKE 1 TABLET DAILY IN THE EVENING.   citalopram 20 MG tablet Commonly known as:  CELEXA TAKE 1 TABLET DAILY IN THE MORNING   doxycycline 50 MG capsule Commonly known as:  VIBRAMYCIN Take 1 capsule (50 mg total) by mouth 2 (two) times daily.   ibuprofen 200 MG tablet Commonly known as:  ADVIL,MOTRIN Take 200-400 mg by mouth every 6 (six) hours as needed (For pain/headache.).   methocarbamol 500 MG tablet Commonly known as:  ROBAXIN Take 1 tablet (500 mg total) by mouth every 8 (eight) hours as needed for muscle spasms.   metoprolol succinate 25 MG 24 hr tablet Commonly known as:  TOPROL-XL TAKE 1 TABLET DAILY IN THE EVENING   oxyCODONE 5 MG immediate release tablet Commonly known as:  Oxy IR/ROXICODONE Take 1-2 tablets (5-10 mg total) by mouth every 4 (four) hours as needed for moderate pain.   Vitamin D 2000 units Caps Take 2,000 Units by mouth daily.      Follow-up Information    Vadis Slabach, MD Follow up in 1 week(s).   Specialty:  Plastic Surgery Why:  as scheduled Contact information: [address] [place] [address] [id number]        Adin Hector, MD Follow up in 3 week(s).   Specialty:  General Surgery Contact information: [address] [place] [address] [phone]           Signed: Irene Limbo 10/25/2015, 7:37 AM

## 2015-10-25 NOTE — Progress Notes (Signed)
1 Day Post-Op  Subjective: Stable and alert. Comfortable No wound problems reported Ambulating to bathroom and voiding without difficulty Ambulating  in hall Tolerating diet  Objective: Vital signs in last 24 hours: Temp:  [97.5 F (36.4 C)-98.4 F (36.9 C)] 98.2 F (36.8 C) (10/24 [id number]) Pulse Rate:  [60-101] 63 (10/24 0614) Resp:  [16-18] 16 (10/24 0614) BP: (95-105)/(57-65) 103/63 (10/24 0614) SpO2:  [96 %-99 %] 99 % (10/24 0614) Weight:  [81.6 kg (179 lb 15 oz)] 81.6 kg (179 lb 15 oz) (10/23 1413) Last BM Date: 10/24/15  Intake/Output from previous day: 10/23 0701 - 10/24 0700 In: 2400 [P.O.:600; I.V.:1400; IV Piggyback:400] Out: 1670 [Urine:1215; Drains:280; Blood:175] Intake/Output this shift: No intake/output data recorded.  General appearance: Alert.  Pleasant.  No distress.  Husband present in room Resp: clear to auscultation bilaterally Chest wall: no tenderness, Examined together with Dr. Iran Planas.  Wounds intact.  Serosanguineous drainage is not excessive.  No sign of hematoma.  Lower skin flap on left slightly is colored.  Lab Results:  No results found for this or any previous visit (from the past 24 hour(s)).   Studies/Results: Dg Chest Port 1 View  Result Date: 10/24/2015 CLINICAL DATA:  Evaluate Port-A-Cath placement.  Breast cancer. EXAM: PORTABLE CHEST 1 VIEW COMPARISON:  None. FINDINGS: Port-A-Cath has been inserted from a LEFT subclavian approach. Tip lies at the cavoatrial junction. There is no pneumothorax. Cardiomediastinal silhouette appears unremarkable. Low lung volumes but no infiltrates or failure. Small clips are seen in both axillae. IMPRESSION: Satisfactory Port-A-Cath placement.  No postoperative pneumothorax. Electronically Signed   By: Staci Righter M.D.   On: 10/24/2015 13:23   Dg Fluoro Guide Cv Line-no Report  Result Date: 10/24/2015 CLINICAL DATA:  FLOURO GUIDE CV LINE Fluoroscopy was utilized by the requesting physician.  No  radiographic interpretation.    Marland Kitchen anastrozole  1 mg Oral Daily  . atorvastatin  20 mg Oral QPM  .  ceFAZolin (ANCEF) IV  1 g Intravenous Q8H  . citalopram  20 mg Oral q morning - 10a  . enoxaparin (LOVENOX) injection  40 mg Subcutaneous Q24H  . ketorolac  30 mg Intravenous Q8H  . metoprolol succinate  25 mg Oral QPM     Assessment/Plan: s/p Procedure(s): RIGHT MASTECTOMY WITH RIGHT AXILLARY SENTINEL LYMPH NODE BIOPSY. LEFT PROPHYLATIC MASTECTOMY. INSERTION PORT-A-CATH LEFT SUBCLAVIAN BREAST RECONSTRUCTION WITH PLACEMENT OF TISSUE EXPANDER AND CORTIVA  POD #1.  Port-A-Cath insertion, bilateral mastectomy, right axillary sentinel node biopsy, breast reconstruction with tissue expanders. Stable Advance diet and activities Possible discharge home today if okay with Dr. Iran Planas I will call pathology report to her by Thursday of this week She has appointment see Dr. Lindi Adie next week Follow-up with Dr. Iran Planas one week Follow-up with Dr. Dalbert Batman in 3 weeks  @PROBHOSP @  LOS: 0 days    Nicola Heinemann M 10/25/2015  . .prob

## 2015-10-26 NOTE — Progress Notes (Signed)
Inform patient of Pathology report,. I called her this afternoon to discuss her pathology report with her, but had to leave a message on the answering machine Please call her this afternoon or tomorrow morning.  Tell her that the right breast had 2 cancers, one measuring 4.5 cm and the second measuring 1 cm.  The resection margins are negative.  Everything has been completely removed and she will not need any further surgery.The sentinel lymph nodes all 3 were negative.  The left breast had no cancer.  This is all good news.  I will be happy to talk to her in the office tomorrow morning.  hmi

## 2015-10-27 NOTE — Addendum Note (Signed)
Addendum  created 10/27/15 0534 by Lillia Abed, MD   Anesthesia Intra Blocks edited, Sign clinical note

## 2015-10-31 ENCOUNTER — Encounter: Payer: Self-pay | Admitting: Hematology and Oncology

## 2015-10-31 ENCOUNTER — Ambulatory Visit: Payer: BC Managed Care – PPO | Admitting: Hematology and Oncology

## 2015-10-31 ENCOUNTER — Ambulatory Visit (HOSPITAL_BASED_OUTPATIENT_CLINIC_OR_DEPARTMENT_OTHER): Payer: BC Managed Care – PPO | Admitting: Hematology and Oncology

## 2015-10-31 DIAGNOSIS — Z170 Estrogen receptor positive status [ER+]: Secondary | ICD-10-CM

## 2015-10-31 DIAGNOSIS — C50411 Malignant neoplasm of upper-outer quadrant of right female breast: Secondary | ICD-10-CM | POA: Diagnosis not present

## 2015-10-31 NOTE — Assessment & Plan Note (Signed)
Bilateral mastectomies 10/24/2015 Right mastectomy: IDC grade 1, 4.5 and 1 cm with DCIS high-grade,LVI present, 0/4 lymph nodes (negative) margins negative; ER 100%, PR 10%, Ki-67 30%, HER-2 negative, ratio 1.44; T2 N0 stage IIA  Left mastectomy: Benign Oncotype DX on biopsy: Recurrence score 36, when he 5% risk of recurrence with tamoxifen, high risk Genetic testing: No mutations identified  Treatment plan: 1. Adjuvant chemotherapy with dose dense Adriamycin Cytoxan 4 followed by Abraxane/ Taxol weekly 12  (patient may choose to undergo adjuvant chemotherapy if no one because it will be closer to her home) 2. Adjuvant radiation therapy if necessary based upon final tumor characteristics followed by 3. Adjuvant antiestrogen therapy with anastrozole 1 mg daily (patient started anastrozole prior to surgery briefly and tolerated it very well) ---------------------------------------------------------------------------------------------------------------------------------------------------- Pathology counseling: I discussed the final pathology report of the patient provided  a copy of this report. I discussed the margins as well as lymph node surgeries. We also discussed the final staging along with previously performed ER/PR and HER-2/neu testing.  Return to clinic based upon her decision to get chemotherapy closer to home. I believe it is in her best interest to get treatment closer to her home.

## 2015-10-31 NOTE — Progress Notes (Signed)
Patient Care Team: Azucena Fallen, DO as PCP - General (Family Medicine)  DIAGNOSIS:  Encounter Diagnosis  Name Primary?  . Malignant neoplasm of upper-outer quadrant of right breast in female, estrogen receptor positive ([place])     SUMMARY OF ONCOLOGIC HISTORY:   Breast cancer of upper-outer quadrant of right female breast ([place])   08/10/2015 Initial Diagnosis    Right breast biopsy 10:00: Grade 2 IDC with DCIS, ER 100%, PR 10%, Ki-67 30%, HER-2 negative, ratio 1.44      08/29/2015 Breast MRI    Multicentric breast cancer 4 masses 1.8 cm, 2.1 cm, 1.2 cm, 0.8 cm total spanning 5.9 x 3.6 x 3.3 cm linear non-mass enhancement extending anteriorly suspicious for DCIS total dimension 5.2 cm no adenopathy      09/05/2015 Procedure    Genetic testing: Normal Oncotype DX score 36, high risk, 25% risk of recurrence with tamoxifen alone      10/24/2015 Surgery    Right mastectomy: IDC grade 1, 4.5 and 1 cm with DCIS high-grade,LVI present, 0/4 lymph nodes (negative) margins negative; T2 N0 stage IIA left mastectomy: Benign       CHIEF COMPLIANT: Follow-up after bilateral mastectomies  INTERVAL HISTORY: Stephanie Hinton is a 65 year old with above-mentioned history of right breast cancer multifocal disease who underwent bilateral mastectomies and is here today to discuss pathology report. She is having pain and discomfort from the recent surgery. She is here to document the adjuvant treatment plan.  REVIEW OF SYSTEMS:   Constitutional: Denies fevers, chills or abnormal weight loss Eyes: Denies blurriness of vision Ears, nose, mouth, throat, and face: Denies mucositis or sore throat Respiratory: Denies cough, dyspnea or wheezes Cardiovascular: Denies palpitation, chest discomfort Gastrointestinal:  Denies nausea, heartburn or change in bowel habits Skin: Denies abnormal skin rashes Lymphatics: Denies new lymphadenopathy or easy bruising Neurological:Denies numbness, tingling or new  weaknesses Behavioral/Psych: Mood is stable, no new changes  Extremities: No lower extremity edema Breast: Recent bilateral mastectomies All other systems were reviewed with the patient and are negative.  I have reviewed the past medical history, past surgical history, social history and family history with the patient and they are unchanged from previous note.  ALLERGIES:  is allergic to penicillin g.  MEDICATIONS:  Current Outpatient Prescriptions  Medication Sig Dispense Refill  . anastrozole (ARIMIDEX) 1 MG tablet Take 1 tablet (1 mg total) by mouth daily. (Patient taking differently: Take 1 mg by mouth daily at 12 noon. ) 90 tablet 3  . atorvastatin (LIPITOR) 20 MG tablet TAKE 1 TABLET DAILY IN THE EVENING.    Marland Kitchen Cholecalciferol (VITAMIN D) 2000 units CAPS Take 2,000 Units by mouth daily.    . citalopram (CELEXA) 20 MG tablet TAKE 1 TABLET DAILY IN THE MORNING    . doxycycline (VIBRAMYCIN) 50 MG capsule Take 1 capsule (50 mg total) by mouth 2 (two) times daily. 12 capsule 0  . ibuprofen (ADVIL,MOTRIN) 200 MG tablet Take 200-400 mg by mouth every 6 (six) hours as needed (For pain/headache.).    Marland Kitchen methocarbamol (ROBAXIN) 500 MG tablet Take 1 tablet (500 mg total) by mouth every 8 (eight) hours as needed for muscle spasms. 30 tablet 0  . metoprolol succinate (TOPROL-XL) 25 MG 24 hr tablet TAKE 1 TABLET DAILY IN THE EVENING    . oxyCODONE (OXY IR/ROXICODONE) 5 MG immediate release tablet Take 1-2 tablets (5-10 mg total) by mouth every 4 (four) hours as needed for moderate pain. 50 tablet 0   No current  facility-administered medications for this visit.     PHYSICAL EXAMINATION: ECOG PERFORMANCE STATUS: 1 - Symptomatic but completely ambulatory  Vitals:   10/31/15 1428  BP: 133/67  Pulse: 63  Resp: 18  Temp: 98.4 F (36.9 C)   Filed Weights   10/31/15 1428  Weight: 189 lb 3.2 oz (85.8 kg)    GENERAL:alert, no distress and comfortable SKIN: skin color, texture, turgor are  normal, no rashes or significant lesions EYES: normal, Conjunctiva are pink and non-injected, sclera clear OROPHARYNX:no exudate, no erythema and lips, buccal mucosa, and tongue normal  NECK: supple, thyroid normal size, non-tender, without nodularity LYMPH:  no palpable lymphadenopathy in the cervical, axillary or inguinal LUNGS: clear to auscultation and percussion with normal breathing effort HEART: regular rate & rhythm and no murmurs and no lower extremity edema ABDOMEN:abdomen soft, non-tender and normal bowel sounds MUSCULOSKELETAL:no cyanosis of digits and no clubbing  NEURO: alert & oriented x 3 with fluent speech, no focal motor/sensory deficits EXTREMITIES: No lower extremity edema  LABORATORY DATA:  I have reviewed the data as listed   Chemistry      Component Value Date/Time   NA 141 10/18/2015 1142   K 4.3 10/18/2015 1142   CL 108 10/18/2015 1142   CO2 26 10/18/2015 1142   BUN 12 10/18/2015 1142   CREATININE 0.71 10/18/2015 1142      Component Value Date/Time   CALCIUM 9.5 10/18/2015 1142   ALKPHOS 61 10/18/2015 1142   AST 20 10/18/2015 1142   ALT 23 10/18/2015 1142   BILITOT 0.5 10/18/2015 1142       Lab Results  Component Value Date   WBC 6.2 10/18/2015   HGB 13.8 10/18/2015   HCT 40.8 10/18/2015   MCV 94.0 10/18/2015   PLT 222 10/18/2015   NEUTROABS 3.8 10/18/2015   ASSESSMENT & PLAN:  Breast cancer of upper-outer quadrant of right female breast ([place]) Bilateral mastectomies 10/24/2015 Right mastectomy: IDC grade 1, 4.5 and 1 cm with DCIS high-grade,LVI present, 0/4 lymph nodes (negative) margins negative; ER 100%, PR 10%, Ki-67 30%, HER-2 negative, ratio 1.44; T2 N0 stage IIA  Left mastectomy: Benign Oncotype DX on biopsy: Recurrence score 36, when he 5% risk of recurrence with tamoxifen, high risk Genetic testing: No mutations identified  Treatment plan: 1. Adjuvant chemotherapy with dose dense Adriamycin Cytoxan 4 followed by Abraxane/ Taxol  weekly 12  (patient may choose to undergo adjuvant chemotherapy if no one because it will be closer to her home) 2. Adjuvant radiation therapy if necessary based upon final tumor characteristics followed by 3. Adjuvant antiestrogen therapy with anastrozole 1 mg daily (patient started anastrozole prior to surgery briefly and tolerated it very well) ---------------------------------------------------------------------------------------------------------------------------------------------------- Pathology counseling: I discussed the final pathology report of the patient provided  a copy of this report. I discussed the margins as well as lymph node surgeries. We also discussed the final staging along with previously performed ER/PR and HER-2/neu testing.  Patient wishes to receive care at [facility] with Dr.Grote.  We will transfer her care over to him. We will be presenting her in the tumor board about the question regarding adjuvant radiation.  I will call Dr.Grote for hand over of her care.  No orders of the defined types were placed in this encounter.  The patient has a good understanding of the overall plan. she agrees with it. she will call with any problems that may develop before the next visit here.   Rulon Eisenmenger, MD 10/31/15

## 2015-11-01 ENCOUNTER — Ambulatory Visit (HOSPITAL_COMMUNITY): Payer: BC Managed Care – PPO

## 2015-11-01 ENCOUNTER — Other Ambulatory Visit (HOSPITAL_COMMUNITY): Payer: BC Managed Care – PPO

## 2015-11-02 ENCOUNTER — Telehealth: Payer: Self-pay

## 2015-11-02 NOTE — Telephone Encounter (Signed)
Called and informed pt cancer claim form was complete and ready for pick up at the front desk of [facility]. Pt verbalized understanding

## 2015-11-04 ENCOUNTER — Other Ambulatory Visit: Payer: Self-pay

## 2015-11-04 MED ORDER — ONDANSETRON HCL 8 MG PO TABS
8.0000 mg | ORAL_TABLET | Freq: Three times a day (TID) | ORAL | 0 refills | Status: DC | PRN
Start: 2015-11-04 — End: 2016-06-05

## 2015-11-07 ENCOUNTER — Other Ambulatory Visit: Payer: Self-pay

## 2015-11-14 ENCOUNTER — Other Ambulatory Visit: Payer: BC Managed Care – PPO

## 2015-11-16 ENCOUNTER — Telehealth: Payer: Self-pay

## 2015-11-16 ENCOUNTER — Telehealth: Payer: Self-pay | Admitting: Hematology and Oncology

## 2015-11-16 NOTE — Telephone Encounter (Signed)
Faxed records for transfer of care to Dr. Fransico Setters per request from Dr. Lindi Adie 's nurse. ([id number] release id)

## 2015-11-16 NOTE — Telephone Encounter (Addendum)
Pt called in asking for her medical records that were supposed to be faxed over to her new dr in [place], where Dr. Lindi Adie referred her to start chemo, since it is closer to home. Pt provided Dr. Arna Medici fax number [phone]. Told pt that we will forward to medical records and have it faxed over today. Pt has no further questions at this time.  Spoke with medical record staff and requested for her oncology medical records to be faxed over today if possible for continuum of care.  Called pt back to confirm that they we have faxed it over again today. Pt verbalized understanding.

## 2015-11-18 ENCOUNTER — Encounter (HOSPITAL_COMMUNITY): Payer: Self-pay

## 2015-11-18 ENCOUNTER — Ambulatory Visit (HOSPITAL_COMMUNITY)
Admission: RE | Admit: 2015-11-18 | Discharge: 2015-11-18 | Disposition: A | Discharge status: Home / Self Care | Payer: BC Managed Care – PPO | Source: Ambulatory Visit | Attending: Family Medicine | Admitting: Family Medicine

## 2015-11-18 ENCOUNTER — Ambulatory Visit (HOSPITAL_BASED_OUTPATIENT_CLINIC_OR_DEPARTMENT_OTHER)
Admission: RE | Admit: 2015-11-18 | Discharge: 2015-11-18 | Disposition: A | Discharge status: Home / Self Care | Payer: BC Managed Care – PPO | Source: Ambulatory Visit | Attending: Family Medicine | Admitting: Family Medicine

## 2015-11-18 VITALS — BP 139/71 | HR 79 | Wt 175.2 lb

## 2015-11-18 DIAGNOSIS — E7800 Pure hypercholesterolemia, unspecified: Secondary | ICD-10-CM | POA: Insufficient documentation

## 2015-11-18 DIAGNOSIS — Z79899 Other long term (current) drug therapy: Secondary | ICD-10-CM | POA: Diagnosis not present

## 2015-11-18 DIAGNOSIS — I10 Essential (primary) hypertension: Secondary | ICD-10-CM | POA: Diagnosis not present

## 2015-11-18 DIAGNOSIS — C50411 Malignant neoplasm of upper-outer quadrant of right female breast: Secondary | ICD-10-CM

## 2015-11-18 NOTE — Progress Notes (Signed)
  Echocardiogram 2D Echocardiogram has been performed.  Stephanie Hinton 11/18/2015, 11:04 AM

## 2015-11-18 NOTE — Patient Instructions (Signed)
We will contact you in 2 months to schedule your next appointment.  

## 2015-11-19 NOTE — Progress Notes (Signed)
Oncology: Dr. Lindi Adie  65 yo with history of HTN, hyperlipidemia, and breast cancer presents for cardio-oncology appt. Right breast cancer was diagnosed 8/17, ER+/PR+/HER2-.   She had right mastectomy in 10/17.  Planned for Adriamycin/Cytoxan x 4 cycles, then Abraxane/Taxol weekly x 12.  I reviewed her initial echo today, LV systolic function appears normal.   She has no history of heart disease, but father had MI around age 74.  No chest pain.  No exertional dyspnea.    PMH: 1. HTN 2. Hyperlipidemia 3. Breast cancer: Right breast cancer diagnosed 8/17, ER+/PR+/HER2-.   She had right mastectomy in 10/17.  Planned for Adriamycin/Cytoxan x 4 cycles, then Abraxane/Taxol weekly x 12.   - Echo (11/17): EF 55-60%, GLS -17.8% (technically difficult study, not sure this is accurate), lateral s' 11.8, normal RV size and systolic function.   Social History   Social History  . Marital status: Divorced    Spouse name: N/A  . Number of children: N/A  . Years of education: N/A   Occupational History  . Not on file.   Social History Main Topics  . Smoking status: Never Smoker  . Smokeless tobacco: Never Used  . Alcohol use 4.2 oz/week    7 Glasses of wine per week  . Drug use: No  . Sexual activity: Yes   Other Topics Concern  . Not on file   Social History Narrative  . No narrative on file   Family History  Problem Relation Age of Onset  . Skin cancer Mother     BCC and SCCs  . Heart disease Father   . Congestive Heart Failure Father   . COPD Father     smoker  . Skin cancer Brother     outside a lot; unspecified type  . Lung cancer Maternal Aunt     d. late 74s; former smoker  . Cancer Paternal Aunt     d. 61s; intestinal/colon cancer  . Cancer Paternal Uncle     d. 36s; blood or bone cancer  . Other Maternal Grandmother     "sores on her legs"; very frail  . Heart disease Paternal Grandmother     d. early 60s  . Skin cancer Sister     BCC  . Colon polyps Sister    approx 3 polyps dx 61y or younger  . Breast cancer Sister 75    reportedly negative BRCA1/2 in the 1990s  . Ovarian cancer Sister 71    "early BL ovarian cancer/pre-cancer" s/p BSO  . Heart Problems Maternal Aunt   . Diabetes Maternal Aunt   . Heart Problems Paternal Aunt   . Lupus Paternal Aunt   . Breast cancer Paternal Aunt     dx 50s-60s  . Emphysema Paternal Aunt   . Alzheimer's disease Paternal Uncle   . Heart disease Paternal Uncle   . COPD Paternal Uncle   . Heart disease Paternal Uncle   . Cancer Cousin     paternal 1st cousin dx late 36s; NOS cancer   ROS: All systems reviewed and negative except as per HPI.   Current Outpatient Prescriptions  Medication Sig Dispense Refill  . anastrozole (ARIMIDEX) 1 MG tablet Take 1 tablet (1 mg total) by mouth daily. 90 tablet 3  . atorvastatin (LIPITOR) 20 MG tablet TAKE 1 TABLET DAILY IN THE EVENING.    Marland Kitchen Cholecalciferol (VITAMIN D) 2000 units CAPS Take 2,000 Units by mouth daily.    . citalopram (CELEXA) 20 MG tablet TAKE  1 TABLET DAILY IN THE MORNING    . ibuprofen (ADVIL,MOTRIN) 200 MG tablet Take 200-400 mg by mouth every 6 (six) hours as needed (For pain/headache.).    Marland Kitchen metoprolol succinate (TOPROL-XL) 25 MG 24 hr tablet TAKE 1 TABLET DAILY IN THE EVENING    . methocarbamol (ROBAXIN) 500 MG tablet Take 1 tablet (500 mg total) by mouth every 8 (eight) hours as needed for muscle spasms. (Patient not taking: Reported on 11/18/2015) 30 tablet 0  . ondansetron (ZOFRAN) 8 MG tablet Take 1 tablet (8 mg total) by mouth every 8 (eight) hours as needed for nausea or vomiting. (Patient not taking: Reported on 11/18/2015) 20 tablet 0  . oxyCODONE (OXY IR/ROXICODONE) 5 MG immediate release tablet Take 1-2 tablets (5-10 mg total) by mouth every 4 (four) hours as needed for moderate pain. (Patient not taking: Reported on 11/18/2015) 50 tablet 0   No current facility-administered medications for this encounter.    BP 139/71   Pulse 79   Wt  175 lb 4 oz (79.5 kg)   SpO2 99%   BMI 30.08 kg/m  General: NAD Neck: No JVD, no thyromegaly or thyroid nodule.  Lungs: Clear to auscultation bilaterally with normal respiratory effort. CV: Nondisplaced PMI.  Heart regular S1/S2, no S3/S4, no murmur.  No peripheral edema.  No carotid bruit.  Normal pedal pulses.  Abdomen: Soft, nontender, no hepatosplenomegaly, no distention.  Skin: Intact without lesions or rashes.  Neurologic: Alert and oriented x 3.  Psych: Normal affect. Extremities: No clubbing or cyanosis.  HEENT: Normal.   Assessment/Plan: 65 yo with breast cancer.  She will be getting Adriamycin-based chemotherapy starting soon.  We discussed the potential cardio-toxicity of Adriamycin and the rationale behind echo screening.  She will return in 2 months for a repeat echo.  If this looks ok, will repeat echo 6 months after that to catch any late cardiotoxicity.    Loralie Champagne 11/19/2015

## 2015-11-21 ENCOUNTER — Other Ambulatory Visit: Payer: BC Managed Care – PPO

## 2015-11-23 ENCOUNTER — Telehealth: Payer: Self-pay | Admitting: Hematology and Oncology

## 2015-11-23 NOTE — Telephone Encounter (Signed)
Faxed records to Dr. Raynaldo Opitz ([id number] release id)

## 2015-11-28 ENCOUNTER — Encounter: Payer: Self-pay | Admitting: *Deleted

## 2015-11-28 ENCOUNTER — Telehealth: Payer: Self-pay | Admitting: Hematology and Oncology

## 2015-11-28 NOTE — Telephone Encounter (Signed)
Faxed pt records to  [phone]

## 2016-01-17 ENCOUNTER — Ambulatory Visit (HOSPITAL_COMMUNITY)
Admission: RE | Admit: 2016-01-17 | Discharge: 2016-01-17 | Disposition: A | Discharge status: Home / Self Care | Payer: BC Managed Care – PPO | Source: Ambulatory Visit | Attending: Family Medicine | Admitting: Family Medicine

## 2016-01-17 ENCOUNTER — Ambulatory Visit (HOSPITAL_BASED_OUTPATIENT_CLINIC_OR_DEPARTMENT_OTHER)
Admission: RE | Admit: 2016-01-17 | Discharge: 2016-01-17 | Disposition: A | Discharge status: Home / Self Care | Payer: BC Managed Care – PPO | Source: Ambulatory Visit | Attending: Cardiology | Admitting: Cardiology

## 2016-01-17 ENCOUNTER — Encounter (HOSPITAL_COMMUNITY): Payer: Self-pay

## 2016-01-17 VITALS — BP 110/70 | HR 80 | Wt 171.5 lb

## 2016-01-17 DIAGNOSIS — C50411 Malignant neoplasm of upper-outer quadrant of right female breast: Secondary | ICD-10-CM | POA: Diagnosis not present

## 2016-01-17 DIAGNOSIS — Z09 Encounter for follow-up examination after completed treatment for conditions other than malignant neoplasm: Secondary | ICD-10-CM | POA: Insufficient documentation

## 2016-01-17 NOTE — Patient Instructions (Signed)
Follow up and Echo in 6 months with Dr.McLean

## 2016-01-17 NOTE — Progress Notes (Signed)
  Echocardiogram 2D Echocardiogram has been performed.  Tresa Res 01/17/2016, 11:41 AM

## 2016-01-19 NOTE — Progress Notes (Signed)
Oncology: Dr. Lindi Adie  66 yo with history of HTN, hyperlipidemia, and breast cancer presents for cardio-oncology appt. Right breast cancer was diagnosed 8/17, ER+/PR+/HER2-.   She had right mastectomy in 10/17.  Planned for Adriamycin/Cytoxan x 4 cycles, then Abraxane/Taxol weekly x 12. She has 1 cycle of Adriamycin left.   She has no history of heart disease, but father had MI around age 39.  No chest pain.  No exertional dyspnea.    PMH: 1. HTN 2. Hyperlipidemia 3. Breast cancer: Right breast cancer diagnosed 8/17, ER+/PR+/HER2-.   She had right mastectomy in 10/17.  Planned for Adriamycin/Cytoxan x 4 cycles, then Abraxane/Taxol weekly x 12.   - Echo (11/17): EF 55-60%, GLS -17.8% (technically difficult study, not sure this is accurate), lateral s' 11.8, normal RV size and systolic function.  - Echo (1/18): EF 55-60%, GLS -20.8%.   Social History   Social History  . Marital status: Divorced    Spouse name: N/A  . Number of children: N/A  . Years of education: N/A   Occupational History  . Not on file.   Social History Main Topics  . Smoking status: Never Smoker  . Smokeless tobacco: Never Used  . Alcohol use 4.2 oz/week    7 Glasses of wine per week  . Drug use: No  . Sexual activity: Yes   Other Topics Concern  . Not on file   Social History Narrative  . No narrative on file   Family History  Problem Relation Age of Onset  . Skin cancer Mother     BCC and SCCs  . Heart disease Father   . Congestive Heart Failure Father   . COPD Father     smoker  . Skin cancer Brother     outside a lot; unspecified type  . Lung cancer Maternal Aunt     d. late 39s; former smoker  . Cancer Paternal Aunt     d. 57s; intestinal/colon cancer  . Cancer Paternal Uncle     d. 21s; blood or bone cancer  . Other Maternal Grandmother     "sores on her legs"; very frail  . Heart disease Paternal Grandmother     d. early 39s  . Skin cancer Sister     BCC  . Colon polyps Sister    approx 3 polyps dx 61y or younger  . Breast cancer Sister 41    reportedly negative BRCA1/2 in the 1990s  . Ovarian cancer Sister 30    "early BL ovarian cancer/pre-cancer" s/p BSO  . Heart Problems Maternal Aunt   . Diabetes Maternal Aunt   . Heart Problems Paternal Aunt   . Lupus Paternal Aunt   . Breast cancer Paternal Aunt     dx 50s-60s  . Emphysema Paternal Aunt   . Alzheimer's disease Paternal Uncle   . Heart disease Paternal Uncle   . COPD Paternal Uncle   . Heart disease Paternal Uncle   . Cancer Cousin     paternal 1st cousin dx late 14s; NOS cancer   ROS: All systems reviewed and negative except as per HPI.   Current Outpatient Prescriptions  Medication Sig Dispense Refill  . anastrozole (ARIMIDEX) 1 MG tablet Take 1 tablet (1 mg total) by mouth daily. 90 tablet 3  . atorvastatin (LIPITOR) 20 MG tablet TAKE 1 TABLET DAILY IN THE EVENING.    Marland Kitchen Cholecalciferol (VITAMIN D) 2000 units CAPS Take 2,000 Units by mouth daily.    . citalopram (CELEXA) 20  MG tablet TAKE 1 TABLET DAILY IN THE MORNING    . metoprolol succinate (TOPROL-XL) 25 MG 24 hr tablet TAKE 1 TABLET DAILY IN THE EVENING    . methocarbamol (ROBAXIN) 500 MG tablet Take 1 tablet (500 mg total) by mouth every 8 (eight) hours as needed for muscle spasms. (Patient not taking: Reported on 01/17/2016) 30 tablet 0  . ondansetron (ZOFRAN) 8 MG tablet Take 1 tablet (8 mg total) by mouth every 8 (eight) hours as needed for nausea or vomiting. (Patient not taking: Reported on 01/17/2016) 20 tablet 0  . oxyCODONE (OXY IR/ROXICODONE) 5 MG immediate release tablet Take 1-2 tablets (5-10 mg total) by mouth every 4 (four) hours as needed for moderate pain. (Patient not taking: Reported on 01/17/2016) 50 tablet 0   No current facility-administered medications for this encounter.    BP 110/70   Pulse 80   Wt 171 lb 8 oz (77.8 kg)   SpO2 98%   BMI 29.44 kg/m  General: NAD Neck: No JVD, no thyromegaly or thyroid nodule.  Lungs:  Clear to auscultation bilaterally with normal respiratory effort. CV: Nondisplaced PMI.  Heart regular S1/S2, no S3/S4, no murmur.  No peripheral edema.  No carotid bruit.  Normal pedal pulses.  Abdomen: Soft, nontender, no hepatosplenomegaly, no distention.  Skin: Intact without lesions or rashes.  Neurologic: Alert and oriented x 3.  Psych: Normal affect. Extremities: No clubbing or cyanosis.  HEENT: Normal.   Assessment/Plan: 66 yo with breast cancer.  She has one more cycle of Adriamycin-based chemotherapy.  Echo done today was reviewed and showed normal systolic function and strain pattern.  I will plan on a repeat echo in 6 months to look for any late effects from Adriamycin.  Loralie Champagne 01/19/2016

## 2016-03-08 ENCOUNTER — Encounter (HOSPITAL_COMMUNITY): Payer: Self-pay

## 2016-04-04 ENCOUNTER — Encounter (HOSPITAL_COMMUNITY): Payer: Self-pay

## 2016-05-14 NOTE — H&P (Signed)
Subjective:     Patient ID: Stephanie Hinton is a 66 y.o. female.  Follow-up    7 months post op. Completed adjuvant chemotherapy with Dr. 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 Developed onset low grade temp and erythema and pain breast end April and was referred to [facility] ED. Prescribed clindamycin week course. Seen 2 days later by PCP and Levaquin added. She then asked her Oncologist who recommended she take only one antibiotic so she held the Levaquin. She completed clindamycin but felt chest still red and started Levaquin and completed yesterday. No further fevers, resolution pain. She was counseled the Levaquin would penetrate any infection on expander vs clindamycin only treated infection through blood.  Presented following screening MMG with asymmetry noted on right. [place] with mass UOQ at 10 o'clock, 7 cm from the nipple, measuring 2.8 x 2.1 x 1.4 cm, with associated pleomorphic microcalcifications. Biopsy IDC with DCIS, ER +/, PR +, HER-2 -. MRI demonstrated multicentric breast cancer 4 masses 1.8 cm, 2.1 cm, 1.2 cm, 0.8 cm total spanning 5.9 x 3.6 x 3.3 cm linear non-mass enhancement extending anteriorly suspicious for DCIS total dimension 5.2 cm no adenopathy. Given this mastectomy recommended. 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  Oncotype 36, high risk. Genetics negative.   Prior 36B, happy with this. Right mastectomy 785 g, left 769 g  Review of Systems     Objective:   Physical Exam  Cardiovascular: Normal rate, regular rhythm and normal heart sounds.   Pulmonary/Chest: Effort normal and breath sounds normal.  Abdominal: Soft.   Chest: right chest without erythema,  depression lower inner quadrant bilateral, more on right with apparent contracture capsule on right, left expander has dropped and IMF asymmetric, exander on left more lateral on chest, bilateral animation BW R 13.5 L  13.5 Depression contour superior pole left chest that largely corrects with manual repositioning expander Left chest port  Assessment:     Right Breast ca UOQ and multicentric S/p bilateral SSM, TE/ADM (Cortiva) reconstruction Adjuvant chemotherapy    Plan:     New pictures today. Plan implant exchange, removal left chest port. Left expander has moved inferior and lateral. Plan possible ADM reinforcement on this side.  If used will likely have drain on this side. Plan OP surgery.  We discussed implant types last visit and patient has elected for smooth round saline implants.   Patient has declined fat grafting at this time.  Asked patient that she let me know if the erythema recurs. Counseled no antibiotic will jump onto expander- that expander, implant area prosthetic devices and if contaminated infection may not clear without implant exchange surgery. Counseled I did not see her at time of acute cellulitis but I am concerned right expander is contaminated and may have recurrent problems with cellulitis and should proceed with implant exchange. She has declined to schedule her surgery to date as other family members needed surgery as well. She is agreeable to scheduling surgery at this time within next few weeks. We reviewed her last Hb and WBC and these are adequate for proceeding. Counseled I cannot assure her that implant exchange will prevent further infection or cellulitis and this is always a risk. Given recent events I certainly agree with smooth device choice.    Natrelle 133 MX-12-T 400 ml tissue expanders placed bilateral,  RIGHT fill volume 460 ml LEFT fill volume 460 ml  Glenna Fellows, MD MBA  Plastic & Reconstructive Surgery [phone], pin [phone]

## 2016-06-05 ENCOUNTER — Encounter (HOSPITAL_BASED_OUTPATIENT_CLINIC_OR_DEPARTMENT_OTHER): Payer: Self-pay | Admitting: *Deleted

## 2016-06-11 NOTE — Anesthesia Preprocedure Evaluation (Signed)
Anesthesia Evaluation  Patient identified by MRN, date of birth, ID band Patient awake    Reviewed: Allergy & Precautions, NPO status , Patient's Chart, lab work & pertinent test results  Airway Mallampati: I  TM Distance: >3 FB Neck ROM: Full    Dental no notable dental hx.    Pulmonary    Pulmonary exam normal breath sounds clear to auscultation       Cardiovascular hypertension, Pt. on medications Normal cardiovascular exam Rhythm:Regular Rate:Normal     Neuro/Psych Anxiety    GI/Hepatic   Endo/Other    Renal/GU      Musculoskeletal   Abdominal   Peds  Hematology   Anesthesia Other Findings   Reproductive/Obstetrics                             Anesthesia Physical  Anesthesia Plan  ASA: II  Anesthesia Plan: General   Post-op Pain Management:    Induction: Intravenous  PONV Risk Score and Plan: 3 and Ondansetron, Dexamethasone, Propofol, Midazolam and Treatment may vary due to age or medical condition  Airway Management Planned: Oral ETT  Additional Equipment:   Intra-op Plan:   Post-operative Plan: Extubation in OR  Informed Consent: I have reviewed the patients History and Physical, chart, labs and discussed the procedure including the risks, benefits and alternatives for the proposed anesthesia with the patient or authorized representative who has indicated his/her understanding and acceptance.   Dental advisory given  Plan Discussed with: CRNA  Anesthesia Plan Comments:         Anesthesia Quick Evaluation

## 2016-06-12 ENCOUNTER — Ambulatory Visit (HOSPITAL_BASED_OUTPATIENT_CLINIC_OR_DEPARTMENT_OTHER)
Admission: RE | Admit: 2016-06-12 | Discharge: 2016-06-12 | Disposition: A | Discharge status: Home / Self Care | Payer: BC Managed Care – PPO | Source: Ambulatory Visit | Attending: Plastic Surgery | Admitting: Plastic Surgery

## 2016-06-12 ENCOUNTER — Encounter (HOSPITAL_BASED_OUTPATIENT_CLINIC_OR_DEPARTMENT_OTHER): Payer: Self-pay | Admitting: Anesthesiology

## 2016-06-12 ENCOUNTER — Encounter (HOSPITAL_BASED_OUTPATIENT_CLINIC_OR_DEPARTMENT_OTHER)
Admission: RE | Disposition: A | Discharge status: Home / Self Care | Payer: Self-pay | Source: Ambulatory Visit | Attending: Plastic Surgery

## 2016-06-12 ENCOUNTER — Ambulatory Visit (HOSPITAL_BASED_OUTPATIENT_CLINIC_OR_DEPARTMENT_OTHER): Payer: BC Managed Care – PPO | Admitting: Anesthesiology

## 2016-06-12 DIAGNOSIS — I10 Essential (primary) hypertension: Secondary | ICD-10-CM | POA: Diagnosis not present

## 2016-06-12 DIAGNOSIS — Z421 Encounter for breast reconstruction following mastectomy: Secondary | ICD-10-CM | POA: Insufficient documentation

## 2016-06-12 DIAGNOSIS — R222 Localized swelling, mass and lump, trunk: Secondary | ICD-10-CM | POA: Insufficient documentation

## 2016-06-12 DIAGNOSIS — Z79899 Other long term (current) drug therapy: Secondary | ICD-10-CM | POA: Diagnosis not present

## 2016-06-12 DIAGNOSIS — Z853 Personal history of malignant neoplasm of breast: Secondary | ICD-10-CM | POA: Insufficient documentation

## 2016-06-12 DIAGNOSIS — Z9013 Acquired absence of bilateral breasts and nipples: Secondary | ICD-10-CM | POA: Insufficient documentation

## 2016-06-12 DIAGNOSIS — Z9221 Personal history of antineoplastic chemotherapy: Secondary | ICD-10-CM | POA: Insufficient documentation

## 2016-06-12 DIAGNOSIS — Z79811 Long term (current) use of aromatase inhibitors: Secondary | ICD-10-CM | POA: Insufficient documentation

## 2016-06-12 DIAGNOSIS — Z452 Encounter for adjustment and management of vascular access device: Secondary | ICD-10-CM | POA: Insufficient documentation

## 2016-06-12 HISTORY — PX: REMOVAL OF BILATERAL TISSUE EXPANDERS WITH PLACEMENT OF BILATERAL BREAST IMPLANTS: SHX6431

## 2016-06-12 HISTORY — PX: PORT-A-CATH REMOVAL: SHX5289

## 2016-06-12 HISTORY — DX: Myoneural disorder, unspecified: G70.9

## 2016-06-12 SURGERY — REMOVAL, TISSUE EXPANDER, BREAST, BILATERAL, WITH BILATERAL IMPLANT IMPLANT INSERTION
Anesthesia: General | Site: Chest | Laterality: Left

## 2016-06-12 MED ORDER — FENTANYL CITRATE (PF) 100 MCG/2ML IJ SOLN
INTRAMUSCULAR | Status: AC
  Filled 2016-06-12: qty 2

## 2016-06-12 MED ORDER — GABAPENTIN 300 MG PO CAPS
ORAL_CAPSULE | ORAL | Status: AC
  Filled 2016-06-12: qty 1

## 2016-06-12 MED ORDER — DEXAMETHASONE SODIUM PHOSPHATE 4 MG/ML IJ SOLN
INTRAMUSCULAR | Status: DC | PRN
  Administered 2016-06-12: 10 mg via INTRAVENOUS

## 2016-06-12 MED ORDER — SCOPOLAMINE 1 MG/3DAYS TD PT72
MEDICATED_PATCH | TRANSDERMAL | Status: AC
Start: 2016-06-12 — End: 2016-06-12
  Filled 2016-06-12: qty 1

## 2016-06-12 MED ORDER — VANCOMYCIN HCL IN DEXTROSE 1-5 GM/200ML-% IV SOLN
INTRAVENOUS | Status: AC
  Filled 2016-06-12: qty 200

## 2016-06-12 MED ORDER — ONDANSETRON HCL 4 MG/2ML IJ SOLN
INTRAMUSCULAR | Status: AC
  Filled 2016-06-12: qty 2

## 2016-06-12 MED ORDER — HYDROMORPHONE HCL 1 MG/ML IJ SOLN
INTRAMUSCULAR | Status: AC
  Filled 2016-06-12: qty 0.5

## 2016-06-12 MED ORDER — LACTATED RINGERS IV SOLN
INTRAVENOUS | Status: DC
  Administered 2016-06-12 (×3): via INTRAVENOUS

## 2016-06-12 MED ORDER — SUGAMMADEX SODIUM 200 MG/2ML IV SOLN
INTRAVENOUS | Status: AC
  Filled 2016-06-12: qty 2

## 2016-06-12 MED ORDER — SUGAMMADEX SODIUM 200 MG/2ML IV SOLN
INTRAVENOUS | Status: DC | PRN
  Administered 2016-06-12: 200 mg via INTRAVENOUS

## 2016-06-12 MED ORDER — MEPERIDINE HCL 25 MG/ML IJ SOLN: 6.2500 mg | INTRAMUSCULAR | Status: DC | PRN

## 2016-06-12 MED ORDER — DEXAMETHASONE SODIUM PHOSPHATE 10 MG/ML IJ SOLN
INTRAMUSCULAR | Status: AC
  Filled 2016-06-12: qty 1

## 2016-06-12 MED ORDER — MIDAZOLAM HCL 2 MG/2ML IJ SOLN
1.0000 mg | INTRAMUSCULAR | Status: DC | PRN
  Administered 2016-06-12: 2 mg via INTRAVENOUS

## 2016-06-12 MED ORDER — CHLORHEXIDINE GLUCONATE CLOTH 2 % EX PADS: 6.0000 | MEDICATED_PAD | Freq: Once | CUTANEOUS | Status: DC

## 2016-06-12 MED ORDER — PROMETHAZINE HCL 25 MG/ML IJ SOLN: 6.2500 mg | INTRAMUSCULAR | Status: DC | PRN

## 2016-06-12 MED ORDER — MIDAZOLAM HCL 2 MG/2ML IJ SOLN
INTRAMUSCULAR | Status: AC
  Filled 2016-06-12: qty 2

## 2016-06-12 MED ORDER — SCOPOLAMINE 1 MG/3DAYS TD PT72
1.0000 | MEDICATED_PATCH | Freq: Once | TRANSDERMAL | Status: AC | PRN
  Administered 2016-06-12: 1.5 mg via TRANSDERMAL
  Administered 2016-06-12: 1 via TRANSDERMAL

## 2016-06-12 MED ORDER — SODIUM CHLORIDE 0.9 % IV SOLN
INTRAVENOUS | Status: DC | PRN
  Administered 2016-06-12: 1000 mL

## 2016-06-12 MED ORDER — DOXYCYCLINE HYCLATE 50 MG PO CAPS: 50.0000 mg | ORAL_CAPSULE | Freq: Two times a day (BID) | ORAL | 0 refills | Status: AC

## 2016-06-12 MED ORDER — FENTANYL CITRATE (PF) 100 MCG/2ML IJ SOLN
50.0000 ug | INTRAMUSCULAR | Status: DC | PRN
  Administered 2016-06-12 (×2): 50 ug via INTRAVENOUS

## 2016-06-12 MED ORDER — ONDANSETRON HCL 4 MG/2ML IJ SOLN
INTRAMUSCULAR | Status: DC | PRN
  Administered 2016-06-12: 4 mg via INTRAVENOUS

## 2016-06-12 MED ORDER — GABAPENTIN 300 MG PO CAPS
300.0000 mg | ORAL_CAPSULE | ORAL | Status: AC
  Administered 2016-06-12: 300 mg via ORAL

## 2016-06-12 MED ORDER — PROPOFOL 10 MG/ML IV BOLUS
INTRAVENOUS | Status: DC | PRN
  Administered 2016-06-12: 150 mg via INTRAVENOUS

## 2016-06-12 MED ORDER — EPHEDRINE SULFATE-NACL 50-0.9 MG/10ML-% IV SOSY
PREFILLED_SYRINGE | INTRAVENOUS | Status: DC | PRN
  Administered 2016-06-12 (×4): 10 mg via INTRAVENOUS

## 2016-06-12 MED ORDER — OXYCODONE HCL 5 MG PO TABS
5.0000 mg | ORAL_TABLET | Freq: Once | ORAL | Status: AC
  Administered 2016-06-12: 5 mg via ORAL

## 2016-06-12 MED ORDER — PROPOFOL 500 MG/50ML IV EMUL
INTRAVENOUS | Status: AC
  Filled 2016-06-12: qty 50

## 2016-06-12 MED ORDER — ARTIFICIAL TEARS OPHTHALMIC OINT
TOPICAL_OINTMENT | OPHTHALMIC | Status: AC
  Filled 2016-06-12: qty 3.5

## 2016-06-12 MED ORDER — DOXYCYCLINE HYCLATE 50 MG PO CAPS: 50.0000 mg | ORAL_CAPSULE | Freq: Two times a day (BID) | ORAL | 0 refills | Status: DC

## 2016-06-12 MED ORDER — ACETAMINOPHEN 500 MG PO TABS
ORAL_TABLET | ORAL | Status: AC
  Filled 2016-06-12: qty 2

## 2016-06-12 MED ORDER — LIDOCAINE 2% (20 MG/ML) 5 ML SYRINGE
INTRAMUSCULAR | Status: AC
  Filled 2016-06-12: qty 5

## 2016-06-12 MED ORDER — HYDROMORPHONE HCL 1 MG/ML IJ SOLN
0.2500 mg | INTRAMUSCULAR | Status: DC | PRN
  Administered 2016-06-12 (×2): 0.25 mg via INTRAVENOUS

## 2016-06-12 MED ORDER — OXYCODONE HCL 5 MG PO TABS
ORAL_TABLET | ORAL | Status: AC
  Filled 2016-06-12: qty 1

## 2016-06-12 MED ORDER — CELECOXIB 400 MG PO CAPS
400.0000 mg | ORAL_CAPSULE | ORAL | Status: AC
  Administered 2016-06-12: 400 mg via ORAL

## 2016-06-12 MED ORDER — ROCURONIUM BROMIDE 10 MG/ML (PF) SYRINGE
PREFILLED_SYRINGE | INTRAVENOUS | Status: DC | PRN
  Administered 2016-06-12: 40 mg via INTRAVENOUS
  Administered 2016-06-12 (×3): 10 mg via INTRAVENOUS

## 2016-06-12 MED ORDER — VANCOMYCIN HCL IN DEXTROSE 1-5 GM/200ML-% IV SOLN
1000.0000 mg | INTRAVENOUS | Status: AC
  Administered 2016-06-12: 1000 mg via INTRAVENOUS

## 2016-06-12 MED ORDER — ACETAMINOPHEN 500 MG PO TABS
1000.0000 mg | ORAL_TABLET | ORAL | Status: AC
  Administered 2016-06-12: 1000 mg via ORAL

## 2016-06-12 MED ORDER — OXYCODONE HCL 5 MG PO TABS: 5.0000 mg | ORAL_TABLET | ORAL | 0 refills | Status: AC | PRN

## 2016-06-12 MED ORDER — CELECOXIB 200 MG PO CAPS
ORAL_CAPSULE | ORAL | Status: AC
  Filled 2016-06-12: qty 2

## 2016-06-12 MED FILL — oxyCODONE HCL 5 MG TABS: 5 | 7 days supply | Qty: 40 | Fill #0

## 2016-06-12 MED FILL — DOXYCYCLINE HYC 50 MG CAP: 50 | 7 days supply | Qty: 14 | Fill #0

## 2016-06-12 SURGICAL SUPPLY — 90 items
ALLODERM 8X16 READY TO USE (Tissue) ×2 IMPLANT
ALLODERM RTU 8X16 (Tissue) ×2 IMPLANT
APL SKNCLS STERI-STRIP NONHPOA (GAUZE/BANDAGES/DRESSINGS)
BAG DECANTER FOR FLEXI CONT (MISCELLANEOUS) ×4 IMPLANT
BENZOIN TINCTURE PRP APPL 2/3 (GAUZE/BANDAGES/DRESSINGS) IMPLANT
BINDER BREAST 3XL (BIND) IMPLANT
BINDER BREAST LRG (GAUZE/BANDAGES/DRESSINGS) IMPLANT
BINDER BREAST MEDIUM (GAUZE/BANDAGES/DRESSINGS) IMPLANT
BINDER BREAST XLRG (GAUZE/BANDAGES/DRESSINGS) ×2 IMPLANT
BINDER BREAST XXLRG (GAUZE/BANDAGES/DRESSINGS) IMPLANT
BLADE CLIPPER SURG (BLADE) IMPLANT
BLADE SURG 10 STRL SS (BLADE) ×10 IMPLANT
BLADE SURG 15 STRL LF DISP TIS (BLADE) ×2 IMPLANT
BLADE SURG 15 STRL SS (BLADE) ×4
BNDG GAUZE ELAST 4 BULKY (GAUZE/BANDAGES/DRESSINGS) ×8 IMPLANT
CANISTER SUCT 1200ML W/VALVE (MISCELLANEOUS) ×8 IMPLANT
CHLORAPREP W/TINT 26ML (MISCELLANEOUS) ×4 IMPLANT
CLOSURE WOUND 1/2 X4 (GAUZE/BANDAGES/DRESSINGS)
COVER BACK TABLE 60X90IN (DRAPES) ×4 IMPLANT
COVER MAYO STAND STRL (DRAPES) ×4 IMPLANT
DECANTER SPIKE VIAL GLASS SM (MISCELLANEOUS) IMPLANT
DRAIN CHANNEL 15F RND FF W/TCR (WOUND CARE) ×2 IMPLANT
DRAPE LAPAROTOMY 100X72 PEDS (DRAPES) IMPLANT
DRAPE TOP ARMCOVERS (MISCELLANEOUS) ×4 IMPLANT
DRAPE U-SHAPE 76X120 STRL (DRAPES) ×4 IMPLANT
DRAPE UTILITY XL STRL (DRAPES) IMPLANT
DRSG PAD ABDOMINAL 8X10 ST (GAUZE/BANDAGES/DRESSINGS) ×8 IMPLANT
ELECT BLADE 4.0 EZ CLEAN MEGAD (MISCELLANEOUS) ×4
ELECT COATED BLADE 2.86 ST (ELECTRODE) ×4 IMPLANT
ELECT REM PT RETURN 9FT ADLT (ELECTROSURGICAL) ×4
ELECTRODE BLDE 4.0 EZ CLN MEGD (MISCELLANEOUS) ×2 IMPLANT
ELECTRODE REM PT RTRN 9FT ADLT (ELECTROSURGICAL) ×2 IMPLANT
EVACUATOR SILICONE 100CC (DRAIN) ×2 IMPLANT
GAUZE SPONGE 4X4 12PLY STRL LF (GAUZE/BANDAGES/DRESSINGS) IMPLANT
GLOVE BIO SURGEON STRL SZ 6 (GLOVE) ×12 IMPLANT
GLOVE BIOGEL PI IND STRL 7.0 (GLOVE) IMPLANT
GLOVE BIOGEL PI INDICATOR 7.0 (GLOVE) ×6
GLOVE ECLIPSE 6.5 STRL STRAW (GLOVE) ×4 IMPLANT
GLOVE EXAM NITRILE EXT CUFF MD (GLOVE) ×2 IMPLANT
GLOVE SURG SS PI 6.5 STRL IVOR (GLOVE) ×2 IMPLANT
GOWN STRL REUS W/ TWL LRG LVL3 (GOWN DISPOSABLE) ×4 IMPLANT
GOWN STRL REUS W/TWL LRG LVL3 (GOWN DISPOSABLE) ×16
IMPL BREAST SAL 550-590CC (Breast) IMPLANT
IMPL BREAST SALINE 600-640C (Breast) IMPLANT
IMPLANT BREAST SAL 550-590CC (Breast) ×8 IMPLANT
IMPLANT BREAST SALINE 600-640C (Breast) ×4 IMPLANT
IV NS 500ML (IV SOLUTION)
IV NS 500ML BAXH (IV SOLUTION) IMPLANT
KIT FILL SYSTEM UNIVERSAL (SET/KITS/TRAYS/PACK) IMPLANT
LIQUID BAND (GAUZE/BANDAGES/DRESSINGS) ×8 IMPLANT
MARKER SKIN DUAL TIP RULER LAB (MISCELLANEOUS) IMPLANT
NDL HYPO 25X1 1.5 SAFETY (NEEDLE) IMPLANT
NDL HYPO 30GX1 BEV (NEEDLE) IMPLANT
NDL PRECISIONGLIDE 27X1.5 (NEEDLE) ×2 IMPLANT
NEEDLE HYPO 25X1 1.5 SAFETY (NEEDLE) IMPLANT
NEEDLE HYPO 30GX1 BEV (NEEDLE) IMPLANT
NEEDLE PRECISIONGLIDE 27X1.5 (NEEDLE) IMPLANT
NS IRRIG 1000ML POUR BTL (IV SOLUTION) ×2 IMPLANT
PACK BASIN DAY SURGERY FS (CUSTOM PROCEDURE TRAY) ×4 IMPLANT
PENCIL BUTTON HOLSTER BLD 10FT (ELECTRODE) ×4 IMPLANT
PIN SAFETY STERILE (MISCELLANEOUS) IMPLANT
SHEET MEDIUM DRAPE 40X70 STRL (DRAPES) ×6 IMPLANT
SIZER BREAST SGL USE HP 550CC (SIZER) ×4
SIZER BREAST SGL USE HP 650CC (SIZER) ×4
SIZER BRST SGL USE HP 550CC (SIZER) IMPLANT
SIZER BRST SGL USE HP 650CC (SIZER) IMPLANT
SLEEVE SCD COMPRESS KNEE MED (MISCELLANEOUS) ×4 IMPLANT
SPONGE GAUZE 2X2 8PLY STER LF (GAUZE/BANDAGES/DRESSINGS)
SPONGE GAUZE 2X2 8PLY STRL LF (GAUZE/BANDAGES/DRESSINGS) IMPLANT
SPONGE LAP 18X18 X RAY DECT (DISPOSABLE) ×10 IMPLANT
STAPLER VISISTAT 35W (STAPLE) ×4 IMPLANT
STRIP CLOSURE SKIN 1/2X4 (GAUZE/BANDAGES/DRESSINGS) IMPLANT
SUT ETHILON 2 0 FS 18 (SUTURE) ×2 IMPLANT
SUT MNCRL AB 4-0 PS2 18 (SUTURE) ×8 IMPLANT
SUT PDS AB 2-0 CT2 27 (SUTURE) ×6 IMPLANT
SUT VIC AB 3-0 PS1 18 (SUTURE)
SUT VIC AB 3-0 PS1 18XBRD (SUTURE) IMPLANT
SUT VIC AB 3-0 SH 27 (SUTURE) ×16
SUT VIC AB 3-0 SH 27X BRD (SUTURE) ×4 IMPLANT
SUT VICRYL 4-0 PS2 18IN ABS (SUTURE) ×12 IMPLANT
SYR BULB 3OZ (MISCELLANEOUS) IMPLANT
SYR BULB IRRIGATION 50ML (SYRINGE) ×8 IMPLANT
SYR CONTROL 10ML LL (SYRINGE) ×2 IMPLANT
TISSUE ALLDRM RTU 8X16 (Tissue) IMPLANT
TOWEL OR 17X24 6PK STRL BLUE (TOWEL DISPOSABLE) ×8 IMPLANT
TRAY DSU PREP LF (CUSTOM PROCEDURE TRAY) IMPLANT
TUBE CONNECTING 20'X1/4 (TUBING) ×2
TUBE CONNECTING 20X1/4 (TUBING) ×4 IMPLANT
UNDERPAD 30X30 (UNDERPADS AND DIAPERS) ×8 IMPLANT
YANKAUER SUCT BULB TIP NO VENT (SUCTIONS) ×4 IMPLANT

## 2016-06-12 NOTE — Anesthesia Postprocedure Evaluation (Signed)
Anesthesia Post Note  Patient: Stephanie Hinton  Procedure(s) Performed: Procedure(s) (LRB): REMOVAL OF BILATERAL TISSUE EXPANDERS WITH PLACEMENT OF BILATERAL BREAST IMPLANTS WITH ALLODERM TO LEFT CHEST AND EXCISIONAL BIOPSY LEFT CHEST MASS (Bilateral) REMOVAL PORT-A-CATH (Left)     Patient location during evaluation: PACU Anesthesia Type: General Level of consciousness: sedated and patient cooperative Pain management: pain level controlled Vital Signs Assessment: post-procedure vital signs reviewed and stable Respiratory status: spontaneous breathing Cardiovascular status: stable Anesthetic complications: no    Last Vitals:  Vitals:   06/12/16 1215 06/12/16 1230  BP: 109/67 107/66  Pulse: 83 84  Resp: 14 13  Temp:      Last Pain:  Vitals:   06/12/16 1230  TempSrc:   PainSc: 2                  Nolon Nations

## 2016-06-12 NOTE — Interval H&P Note (Signed)
History and Physical Interval Note:  06/12/2016 6:58 AM  Constance Goltz  has presented today for surgery, with the diagnosis of history of breast cancer  The various methods of treatment have been discussed with the patient and family. After consideration of risks, benefits and other options for treatment, the patient has consented to  Procedure(s): REMOVAL OF BILATERAL TISSUE EXPANDERS WITH PLACEMENT OF BILATERAL BREAST IMPLANTS (Bilateral) POSSIBLE REMOVAL PORT-A-CATH POSSIBLE ALLODERM TO CHEST (N/A) as a surgical intervention .  The patient's history has been reviewed, patient examined, no change in status, stable for surgery.  I have reviewed the patient's chart and labs.  Questions were answered to the patient's satisfaction.    Patient has new palpable few mm mass left chest and this has been evaluated by her oncologist, plan excisional biopsy of this.  Stephens Shreve

## 2016-06-12 NOTE — Transfer of Care (Signed)
Immediate Anesthesia Transfer of Care Note  Patient: Stephanie Hinton  Procedure(s) Performed: Procedure(s): REMOVAL OF BILATERAL TISSUE EXPANDERS WITH PLACEMENT OF BILATERAL BREAST IMPLANTS WITH ALLODERM TO LEFT CHEST AND EXCISIONAL BIOPSY LEFT CHEST MASS (Bilateral) REMOVAL PORT-A-CATH (Left)  Patient Location: PACU  Anesthesia Type:General  Level of Consciousness: sedated and obtunded  Airway & Oxygen Therapy: Patient Spontanous Breathing  Post-op Assessment: Report given to RN and Post -op Vital signs reviewed and stable  Post vital signs: Reviewed and stable  Last Vitals:  Vitals:   06/12/16 0637  BP: 114/71  Pulse: 67  Temp: 37 C    Last Pain:  Vitals:   06/12/16 0637  TempSrc: Oral  PainSc: 0-No pain         Complications: No apparent anesthesia complications

## 2016-06-12 NOTE — Op Note (Addendum)
Operative Note   DATE OF OPERATION: 6.12.2018  LOCATION: [facility]-outpatient  SURGICAL DIVISION: Plastic Surgery  PREOPERATIVE DIAGNOSES:  1. History breast cancer 2.Acquired absence breasts 3. Left breast mass  POSTOPERATIVE DIAGNOSES:  same  PROCEDURE:  1. Excisional biopsy left chest mass 2. Removal left chest port 3. Removal bilateral tissue expanders and placement saline implants 4. Acellular dermis (Alloderm) for left breast reconstruction 100 cm2.   SURGEON: Irene Limbo MD MBA  ASSISTANT: none  ANESTHESIA:  General.   EBL: 75 ml  COMPLICATIONS: None immediate.   INDICATIONS FOR PROCEDURE:  The patient, Stephanie Hinton, is a 66 y.o. female born on 1950/01/05, is here for second stage breast reconstruction following bilateral skin sparing mastectomies with immediate expander, acellular dermis reconstruction. She presents with inferior displacement left tissue expander and plan ADM reinforcement of fold on this side. She also has new palpable 2-3 mm mass left chest, plan excisional biopsy.   FINDINGS: Left chest mass at approximately 1 o clock. Full incorporation bilateral of previously placed ADM. Natrelle Smooth Round High Profile Saline implants placed bilateral, 550 ml fill volume 590 ml bilateral. REF [id number] RIGHT SN [id number] LEFT [id number]  DESCRIPTION OF PROCEDURE:  The patient's operative site was marked with the patient in the preoperative area to mark sternal notch, chest midline, anterior axillary lines.Patient demonstrated asymmetry inframammary folds with left lower and desired IMF marked symmetric with right. The patient was taken to the operating room. SCDs were placed and IV antibiotics were given. A time out was performed and all information was confirmed to be correct. The patient's chest was then prepped and draped in a sterile fashion.   Incision made over left chest port and carried through superficial fascia and capsule to port. Sutures  removed and port removed intact. Layered closure completed with 4-0 vicryl for closure of superficial fascia and dermis followed by 4-0 monocryl subcuticular skin closure.  Incision made over palpable mass left chest superior to mastectomy scar. Soft tissue dissected from palpable mass located superior to pectoralis muscle. Incision closed with interrupted 4-0 vicryl in dermis and 4-0 monocryl for skin closure.  Incision made in left chest transverse mastectomy scar. Skin flaps elevated in limited fashion and pectoralis muscle divided in direction of fibers.  Expander removed. Patient demonstrated cavity extending inferiorly beneath desired IMF. Capsulotomies performed medially and superiorly. The capsule with scored in these areas and submuscular dissection completed to accommodate implant. Acellular dermis perforated and inset to chest wall with 2-0 PDS approximately medial to anterior axillary line. The ADM was then redraped over sizer and sutured to anterior capsule with 2-0 PDS. At inframammary fold, similarly the ADM was secured to chest wall at desired IMF. 15 Fr JP placed and secured with 2-0 nylon.  Incision made in right chest transverse mastectomy scar. Skin flaps elevated in limited fashion and pectoralis muscle divided in direction of fibers.  Expander removed.  Capsulotomies performed medially and superiorly. The capsule with scored in these areas, and submuscular dissection completed to accommodate implant. Sizer placed and patient brought to upright sitting position. Natrelle Smooth Round Saline High Projection implants selected, 550 ml.  Patient returned to supine position. Chest cavities irrigated with solution containing Ancef, gentamicin, andbacitracin. Pockest inspected for hemostasis and then irrigated with Betadine. Implant prepared, and placed in right chest cavity and filled to 590 ml. Fill tubing removed. Closure fill tab and orientation implant confirmed.Closure was completed  with interrupted 3-0 vicryl for approximation of pectoralis muscle. 4-0 vicryl placed in  dermis and skin closure completed with 4-0 monocryl subcuticular. Implant then prepared and placed in left chest cavity. The implant was filled to 590 ml. The fill tubing removed. The acellular dermis was draped over anterior surface implant and trimmed. It was secured to pectoralis muscle superior to incision with interrupted 2-0 PDS. Total 100 cm2 acellular dermis utilized. Closure completed in layers with 3-0 vicryl in superficial fascia, 4-0 vicryl in dermis and skin closure with 4-0 monocryl subcuticular. Tissue adhesive applied to all incisions. Dry dressing and breast binder applied.   The patient was allowed to wake from anesthesia, extubated and taken to the recovery room in satisfactory condition.   SPECIMENS: left chest excisional biopsy  DRAINS: 15 Fr JP in left breast reconstruction  Irene Limbo, MD [facility] Plastic & Reconstructive Surgery [phone], pin [phone]

## 2016-06-12 NOTE — Anesthesia Procedure Notes (Signed)
Procedure Name: Intubation Date/Time: 06/12/2016 7:34 AM Performed by: Lyndee Leo Pre-anesthesia Checklist: Patient identified, Emergency Drugs available, Suction available and Patient being monitored Patient Re-evaluated:Patient Re-evaluated prior to inductionOxygen Delivery Method: Circle system utilized Preoxygenation: Pre-oxygenation with 100% oxygen Intubation Type: IV induction Ventilation: Mask ventilation without difficulty Laryngoscope Size: Mac and 3 Grade View: Grade III Tube type: Oral Tube size: 7.0 mm Number of attempts: 1 Airway Equipment and Method: Stylet and Oral airway Placement Confirmation: ETT inserted through vocal cords under direct vision,  positive ETCO2 and breath sounds checked- equal and bilateral Secured at: 22 cm Tube secured with: Tape Dental Injury: Teeth and Oropharynx as per pre-operative assessment  Difficulty Due To: Difficult Airway- due to anterior larynx Future Recommendations: Recommend- induction with short-acting agent, and alternative techniques readily available

## 2016-06-12 NOTE — Discharge Instructions (Signed)
About my Jackson-Pratt Bulb Drain  What is a Jackson-Pratt bulb? A Jackson-Pratt is a soft, round device used to collect drainage. It is connected to a long, thin drainage catheter, which is held in place by one or two small stiches near your surgical incision site. When the bulb is squeezed, it forms a vacuum, forcing the drainage to empty into the bulb.  Emptying the Jackson-Pratt bulb- To empty the bulb: 1. Release the plug on the top of the bulb. 2. Pour the bulb's contents into a measuring container which your nurse will provide. 3. Record the time emptied and amount of drainage. Empty the drain(s) as often as your     doctor or nurse recommends.  Date                  Time                    Amount (Drain 1)                 Amount (Drain 2)  _____________________________________________________________________  _____________________________________________________________________  _____________________________________________________________________  _____________________________________________________________________  _____________________________________________________________________  _____________________________________________________________________  _____________________________________________________________________  _____________________________________________________________________  Squeezing the Jackson-Pratt Bulb- To squeeze the bulb: 1. Make sure the plug at the top of the bulb is open. 2. Squeeze the bulb tightly in your fist. You will hear air squeezing from the bulb. 3. Replace the plug while the bulb is squeezed. 4. Use a safety pin to attach the bulb to your clothing. This will keep the catheter from     pulling at the bulb insertion site.  When to call your doctor- Call your doctor if:  Drain site becomes red, swollen or hot.  You have a fever greater than 101 degrees F.  There is oozing at the drain site.  Drain falls out (apply a guaze  bandage over the drain hole and secure it with tape).  Drainage increases daily not related to activity patterns. (You will usually have more drainage when you are active than when you are resting.)  Drainage has a bad odor.    Post Anesthesia Home Care Instructions  Activity: Get plenty of rest for the remainder of the day. A responsible individual must stay with you for 24 hours following the procedure.  For the next 24 hours, DO NOT: -Drive a car -Paediatric nurse -Drink alcoholic beverages -Take any medication unless instructed by your physician -Make any legal decisions or sign important papers.  Meals: Start with liquid foods such as gelatin or soup. Progress to regular foods as tolerated. Avoid greasy, spicy, heavy foods. If nausea and/or vomiting occur, drink only clear liquids until the nausea and/or vomiting subsides. Call your physician if vomiting continues.  Special Instructions/Symptoms: Your throat may feel dry or sore from the anesthesia or the breathing tube placed in your throat during surgery. If this causes discomfort, gargle with warm salt water. The discomfort should disappear within 24 hours.  If you had a scopolamine patch placed behind your ear for the management of post- operative nausea and/or vomiting:  1. The medication in the patch is effective for 72 hours, after which it should be removed.  Wrap patch in a tissue and discard in the trash. Wash hands thoroughly with soap and water. 2. You may remove the patch earlier than 72 hours if you experience unpleasant side effects which may include dry mouth, dizziness or visual disturbances. 3. Avoid touching the patch. Wash your hands with soap and water after contact with  the patch.   SACRAL DRESSING (Lower Back)   A pressure ulcer is a sore where the skin breaks open   This dressing will be placed on your lower back to protect this area from pressure and moisture and in many cases helps prevent  pressure ulcers from forming   A nurse may place this dressing before your surgery or another procedure   A nurse may also place this dressing if you have other conditions that put you at risk for developing a pressure ulcer   If you are getting up and moving around after surgery, the dressing may be taken off with your first shower. Simply remove it and throw it away.   While you are in the hospital, nurses will change the dressing twice a week as long as you are still at risk for developing a pressure ulcer   This dressing is latex free and made with silicone (for adhesive sensitivity) so it is safe and gentle to the skin

## 2016-06-13 ENCOUNTER — Encounter (HOSPITAL_BASED_OUTPATIENT_CLINIC_OR_DEPARTMENT_OTHER): Payer: Self-pay | Admitting: Plastic Surgery

## 2016-08-23 ENCOUNTER — Other Ambulatory Visit: Payer: Self-pay | Admitting: Hematology and Oncology

## 2017-04-04 IMAGING — MR MR BREAST BILAT WO/W CM
8 of 12 series · 30 of 48 positions shown · IV contrast (17ml Multihance)
Comparison: July 09, 2005 breast MRI and August 10, 2015 mammogram

CLINICAL DATA: Strong family history of breast cancer. Recently
diagnosed right breast cancer.

LABS:  Creatinine 0.6.  GFR 101
EXAM:
BILATERAL BREAST MRI WITH AND WITHOUT CONTRAST
TECHNIQUE: Multiplanar, multisequence MR images of both breasts were obtained
prior to and following the intravenous administration of 14 ml of
MultiHance.

[Series 2: t2_tirm_tra ipat (a-p) · axial · 3.0mm · 0.70mm/px · 1 of 55 slices shown]
[im 1/55]
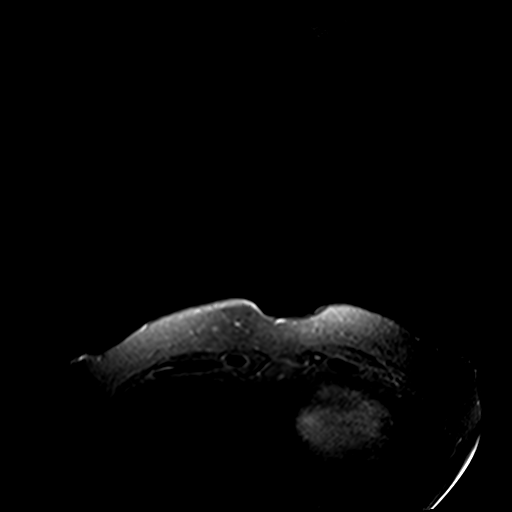

[Series 3: fl3d pre-cm no · axial · non-contrast · 1.2mm · 0.94mm/px · z∈[-135,+37]mm · 5 of 144 slices shown]
[im 1/144]
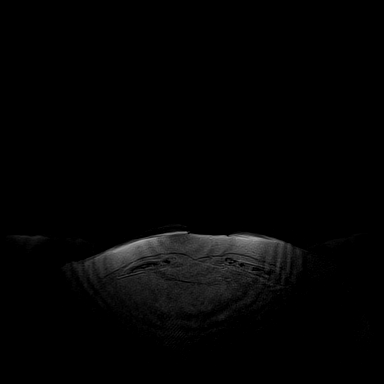
[im 36/144]
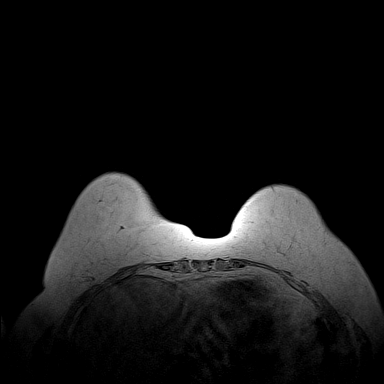
[im 72/144]
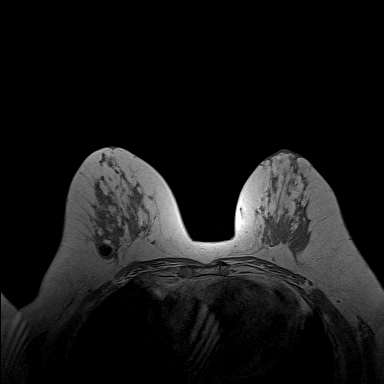
[im 108/144]
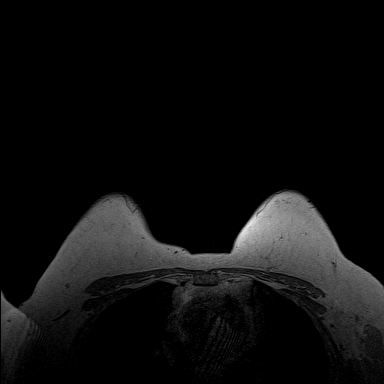
[im 144/144]
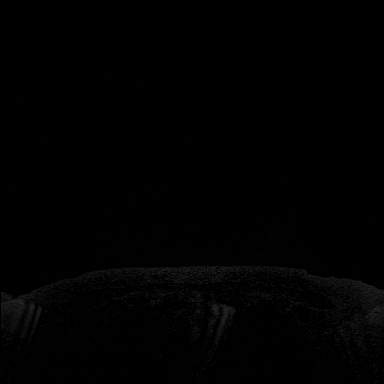

[Series 4: fl3d pre-cm · axial · non-contrast · 1.2mm · 0.94mm/px · z∈[-129,+43]mm · 5 of 144 slices shown]
[im 1/144]
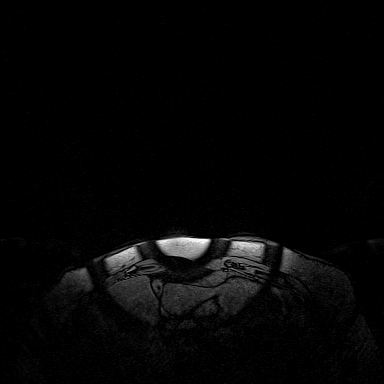
[im 36/144]
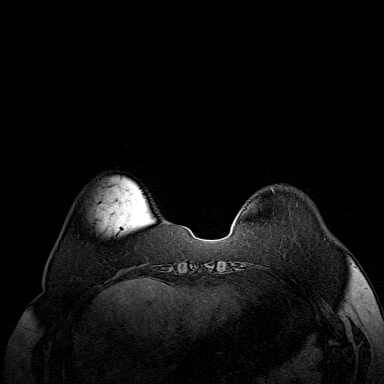
[im 72/144]
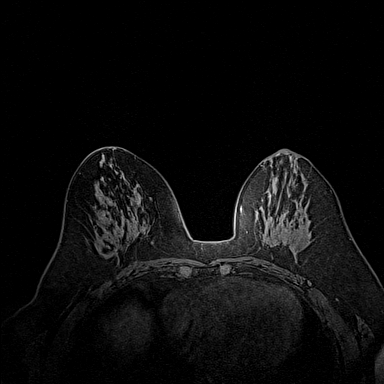
[im 108/144]
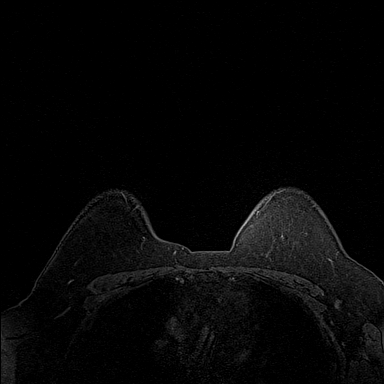
[im 144/144]
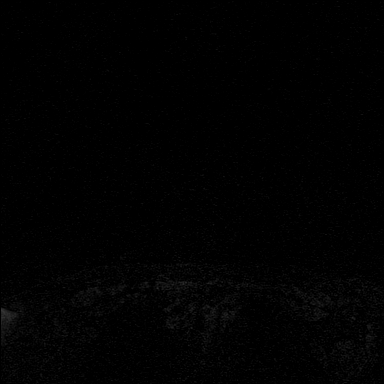

[Series 5: fl3d post-cm 20 · axial · 1.2mm · 0.94mm/px · z∈[-129,+43]mm · 5 of 144 slices shown (1 of 3)]
[im 1/144]
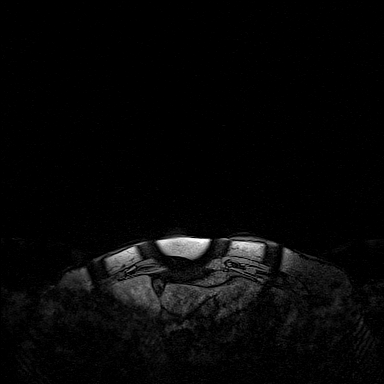
[im 36/144]
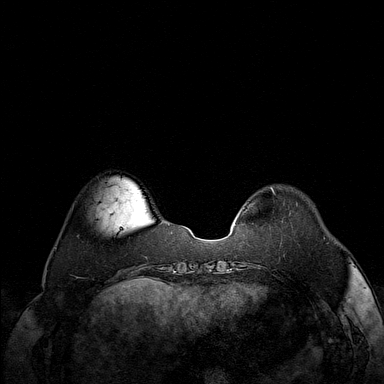
[im 72/144]
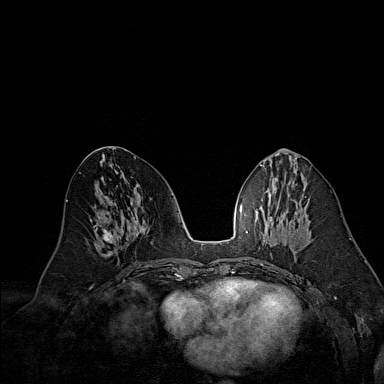
[im 108/144]
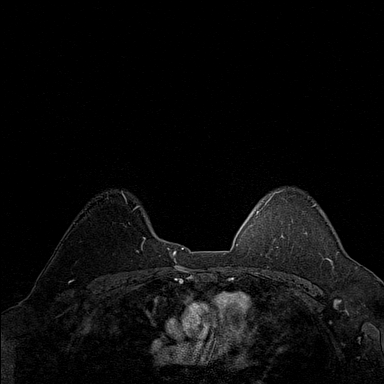
[im 144/144]
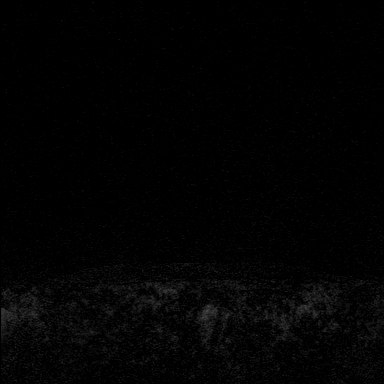

[Series 6: fl3d post-cm 20 · axial · 1.2mm · 0.94mm/px · z∈[-129,+43]mm · 5 of 144 slices shown (2 of 3)]
[im 1/144]
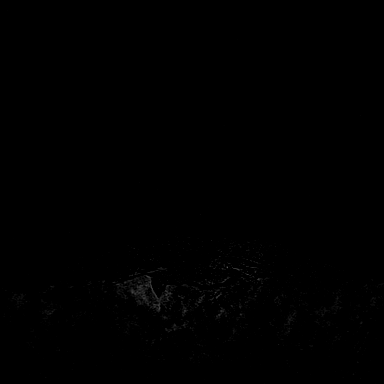
[im 36/144]
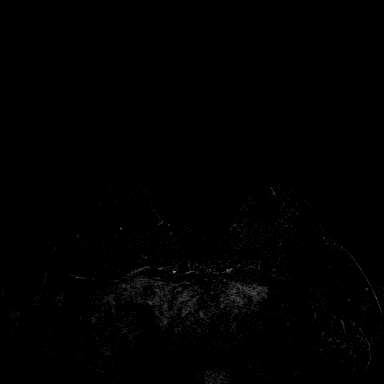
[im 72/144]
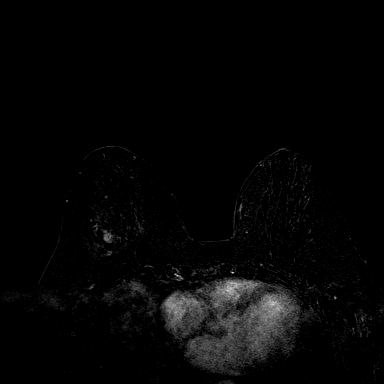
[im 108/144]
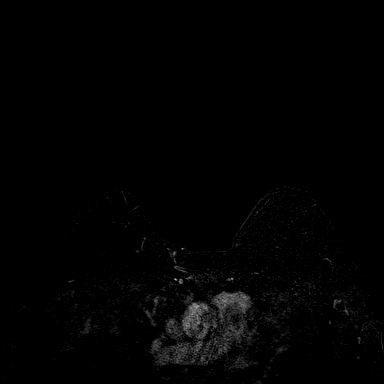
[im 144/144]
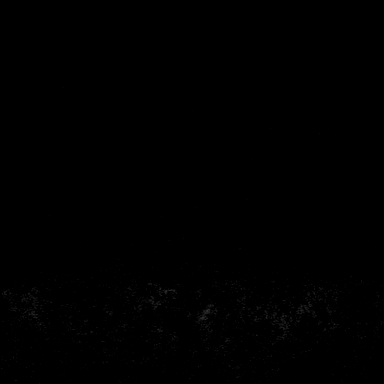

[Series 7: fl3d post-cm 20 · axial · 172.8mm · 0.94mm/px · 1 of 1 slices shown (3 of 3)]
[im 1/1]
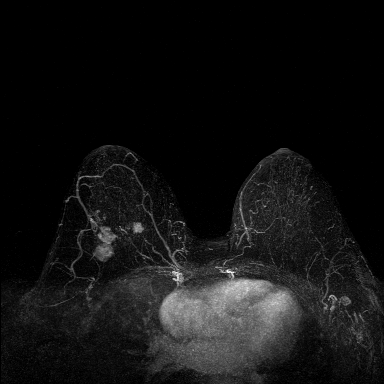

[Series 8: fl3d post-cm 3min · axial · 1.2mm · 0.94mm/px · z∈[-129,+43]mm · 6 of 144 slices shown]
[im 1/144]
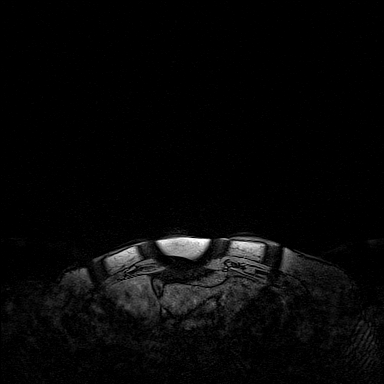
[im 29/144]
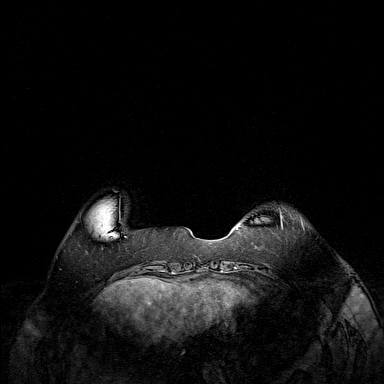
[im 58/144]
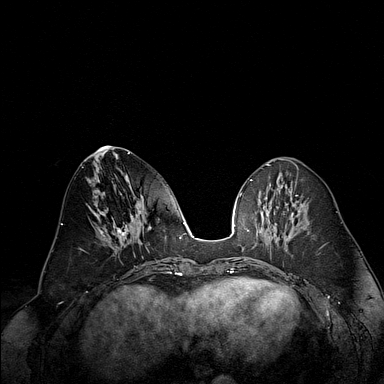
[im 86/144]
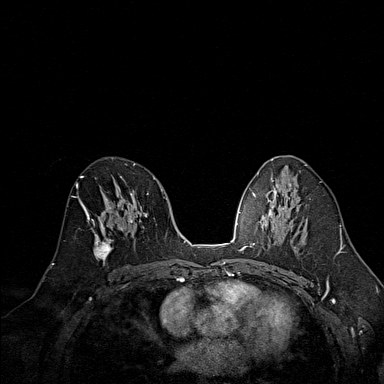
[im 115/144]
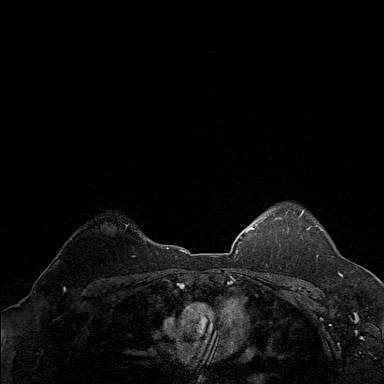
[im 144/144]
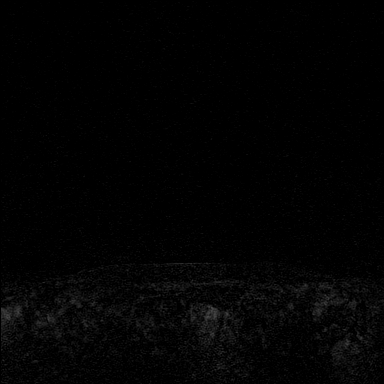

[Series 9: fl3d post-cm 3min_sub · axial · 1.2mm · 0.94mm/px · z∈[-129,-95]mm · 2 of 144 slices shown]
[im 1/144]
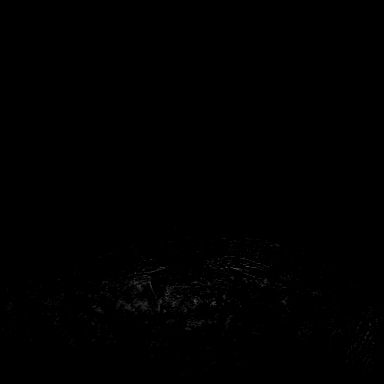
[im 29/144]
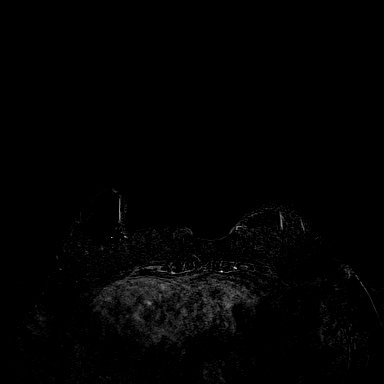

[30 of 48 positions shown; findings below may reference images not displayed]

THREE-DIMENSIONAL MR IMAGE RENDERING ON INDEPENDENT WORKSTATION:

Three-dimensional MR images were rendered by post-processing of the
original MR data on an independent workstation. The
three-dimensional MR images were interpreted, and findings are
reported in the following complete MRI report for this study. Three
dimensional images were evaluated at the independent [employer]
workstation
FINDINGS: Breast composition: The breasts are composed of heterogeneously
dense tissue

Background parenchymal enhancement: Moderate.

Right breast: The biopsied mass in the upper outer right breast
contains a biopsy clip. This mass measures 1.8 by 1.2 by 1.8 cm in
transverse, AP, and cranial caudal dimensions. Just inferior to the
biopsied mass is another highly suspicious irregular mass measuring
2.1 by 1.3 cm in AP and transverse dimensions. Medial and inferior
to the biopsied mass is a third highly suspicious mass measuring
by 1.1 cm on series 9, image 79. Just lateral to this smallest
medial mass is another apparent satellite lesion which is smaller
than the others measuring 8 mm in greatest dimension. The total
maximum extent of disease when measuring all 3 suspicious masses is
5.9 cm in transverse dimension, 3.6 cm in cranial caudal dimension,
and 3.3 cm in AP dimension. On more delayed images, there is non
mass linear enhancement extending anteriorly as seen on series 9,
image 76. Including this non mass enhancement, the maximum AP
dimension is 5.2 cm. Inferiorly in the central and slightly lateral
right breast on series 9, image 98 are 2 tiny masses which
demonstrate persistent kinetics and associated high T2 signal. These
were present in 4223 and are thought to be intramammary nodes. No
other abnormalities in the right breast.

Left breast: No mass or abnormal enhancement.

Lymph nodes: No abnormal appearing lymph nodes.

Ancillary findings:  None.
IMPRESSION: 1. The patient's biopsy proven malignancy is identified in the upper
outer right breast. 2 adjacent masses, as described above, are
consistent with further malignancy. Taking the 3 masses into
account, the findings are most consistent with multi centric
malignancy spanning 5.9 x 3.6 x 3.3 cm. The majority of malignancy
is in the outer right breast but the most medially located
suspicious mass is in the medial right breast. There is also linear
non mass enhancement extending anteriorly suspicious for DCIS. When
accounting for this linear non mass enhancement, the total AP
dimension would be 5.2 cm.
2. No malignancy identified in the left breast.
3. No adenopathy.

RECOMMENDATION:
If breast conservation is being considered, recommend MRI guided
biopsy of the most medially located suspicious mass on series 9,
image 79 and the anterior aspect of the non mass enhancement as seen
on series 9, image 75 to prove extent of disease.

BI-RADS CATEGORY  4: Suspicious.

## 2017-04-11 ENCOUNTER — Other Ambulatory Visit (HOSPITAL_COMMUNITY): Payer: Self-pay | Admitting: *Deleted

## 2017-04-11 DIAGNOSIS — C50411 Malignant neoplasm of upper-outer quadrant of right female breast: Secondary | ICD-10-CM

## 2017-04-12 ENCOUNTER — Encounter (HOSPITAL_COMMUNITY): Payer: Self-pay | Admitting: Cardiology

## 2017-04-12 ENCOUNTER — Ambulatory Visit (HOSPITAL_BASED_OUTPATIENT_CLINIC_OR_DEPARTMENT_OTHER)
Admission: RE | Admit: 2017-04-12 | Discharge: 2017-04-12 | Disposition: A | Discharge status: Home / Self Care | Payer: BC Managed Care – PPO | Source: Ambulatory Visit | Attending: Cardiology | Admitting: Cardiology

## 2017-04-12 ENCOUNTER — Ambulatory Visit (HOSPITAL_COMMUNITY)
Admission: RE | Admit: 2017-04-12 | Discharge: 2017-04-12 | Disposition: A | Discharge status: Home / Self Care | Payer: BC Managed Care – PPO | Source: Ambulatory Visit | Attending: Cardiology | Admitting: Cardiology

## 2017-04-12 VITALS — BP 125/72 | HR 65 | Wt 190.0 lb

## 2017-04-12 DIAGNOSIS — I5030 Unspecified diastolic (congestive) heart failure: Secondary | ICD-10-CM

## 2017-04-12 DIAGNOSIS — I10 Essential (primary) hypertension: Secondary | ICD-10-CM | POA: Insufficient documentation

## 2017-04-12 DIAGNOSIS — C50411 Malignant neoplasm of upper-outer quadrant of right female breast: Secondary | ICD-10-CM

## 2017-04-12 NOTE — Progress Notes (Signed)
  Echocardiogram 2D Echocardiogram has been performed.  [name] 04/12/2017, 10:04 AM

## 2017-04-12 NOTE — Patient Instructions (Signed)
Follow Up As Needed     Congratulations!!!!!!!!!!!!!!!!!!!!!!!!!!!!!!!!!!!!!!!!!

## 2017-04-13 NOTE — Progress Notes (Signed)
Oncology: Dr. Lindi Adie  67 y.o. with history of HTN, hyperlipidemia, and breast cancer presents for cardio-oncology appt. Right breast cancer was diagnosed 8/17, ER+/PR+/HER2-.   She had right mastectomy in 10/17.  She completed Adriamycin/Cytoxan x 4 cycles, then Abraxane/Taxol weekly x 12.   She has no history of heart disease, but father had MI around age 31.    She is doing well, no dyspnea or chest pain.    PMH: 1. HTN 2. Hyperlipidemia 3. Breast cancer: Right breast cancer diagnosed 8/17, ER+/PR+/HER2-.   She had right mastectomy in 10/17.  Planned for Adriamycin/Cytoxan x 4 cycles, then Abraxane/Taxol weekly x 12.   - Echo (11/17): EF 55-60%, GLS -17.8% (technically difficult study, not sure this is accurate), lateral s' 11.8, normal RV size and systolic function.  - Echo (1/18): EF 55-60%, GLS -20.8%.  - Echo (4/19): EF 55-60%, GLS -21.7%  Social History   Socioeconomic History  . Marital status: Divorced    Spouse name: Not on file  . Number of children: Not on file  . Years of education: Not on file  . Highest education level: Not on file  Occupational History  . Not on file  Social Needs  . Financial resource strain: Not on file  . Food insecurity:    Worry: Not on file    Inability: Not on file  . Transportation needs:    Medical: Not on file    Non-medical: Not on file  Tobacco Use  . Smoking status: Never Smoker  . Smokeless tobacco: Never Used  Substance and Sexual Activity  . Alcohol use: Yes    Alcohol/week: 4.2 oz    Types: 7 Glasses of wine per week    Comment: social  . Drug use: No  . Sexual activity: Yes  Lifestyle  . Physical activity:    Days per week: Not on file    Minutes per session: Not on file  . Stress: Not on file  Relationships  . Social connections:    Talks on phone: Not on file    Gets together: Not on file    Attends religious service: Not on file    Active member of club or organization: Not on file    Attends meetings of  clubs or organizations: Not on file    Relationship status: Not on file  . Intimate partner violence:    Fear of current or ex partner: Not on file    Emotionally abused: Not on file    Physically abused: Not on file    Forced sexual activity: Not on file  Other Topics Concern  . Not on file  Social History Narrative  . Not on file   Family History  Problem Relation Age of Onset  . Skin cancer Mother        BCC and SCCs  . Heart disease Father   . Congestive Heart Failure Father   . COPD Father        smoker  . Skin cancer Brother        outside a lot; unspecified type  . Lung cancer Maternal Aunt        d. late 18s; former smoker  . Cancer Paternal Aunt        d. 44s; intestinal/colon cancer  . Cancer Paternal Uncle        d. 80s; blood or bone cancer  . Other Maternal Grandmother        "sores on her legs"; very frail  .  Heart disease Paternal Grandmother        d. early 26s  . Skin cancer Sister        BCC  . Colon polyps Sister        approx 3 polyps dx 61y or younger  . Breast cancer Sister 84       reportedly negative BRCA1/2 in the 1990s  . Ovarian cancer Sister 72       "early BL ovarian cancer/pre-cancer" s/p BSO  . Heart Problems Maternal Aunt   . Diabetes Maternal Aunt   . Heart Problems Paternal Aunt   . Lupus Paternal Aunt   . Breast cancer Paternal Aunt        dx 50s-60s  . Emphysema Paternal Aunt   . Alzheimer's disease Paternal Uncle   . Heart disease Paternal Uncle   . COPD Paternal Uncle   . Heart disease Paternal Uncle   . Cancer Cousin        paternal 1st cousin dx late 12s; NOS cancer   ROS: All systems reviewed and negative except as per HPI.   Current Outpatient Medications  Medication Sig Dispense Refill  . anastrozole (ARIMIDEX) 1 MG tablet TAKE 1 TABLET(1 MG) BY MOUTH DAILY 90 tablet 0  . atorvastatin (LIPITOR) 20 MG tablet TAKE 1 TABLET DAILY IN THE EVENING.    Marland Kitchen Cholecalciferol (VITAMIN D) 2000 units CAPS Take 2,000 Units by  mouth daily.    . citalopram (CELEXA) 20 MG tablet TAKE 1 TABLET DAILY IN THE MORNING    . doxycycline (VIBRAMYCIN) 50 MG capsule Take 1 capsule (50 mg total) by mouth 2 (two) times daily. 14 capsule 0  . gabapentin (NEURONTIN) 300 MG capsule Take 300 mg by mouth 3 (three) times daily.    . metoprolol succinate (TOPROL-XL) 25 MG 24 hr tablet TAKE 1 TABLET DAILY IN THE EVENING    . oxyCODONE (ROXICODONE) 5 MG immediate release tablet Take 1-2 tablets (5-10 mg total) by mouth every 4 (four) hours as needed for severe pain. 40 tablet 0   No current facility-administered medications for this encounter.    BP 125/72   Pulse 65   Wt 190 lb (86.2 kg)   SpO2 97%   BMI 32.61 kg/m  General: NAD Neck: No JVD, no thyromegaly or thyroid nodule.  Lungs: Clear to auscultation bilaterally with normal respiratory effort. CV: Nondisplaced PMI.  Heart regular S1/S2, no S3/S4, no murmur.  No peripheral edema.  No carotid bruit.  Normal pedal pulses.  Abdomen: Soft, nontender, no hepatosplenomegaly, no distention.  Skin: Intact without lesions or rashes.  Neurologic: Alert and oriented x 3.  Psych: Normal affect. Extremities: No clubbing or cyanosis.  HEENT: Normal.   Assessment/Plan: 67 yo with breast cancer.  Echo done today was reviewed and showed normal systolic function and strain pattern.  This shows no evidence for delayed cardio-toxicity from Adriamycin.    Patient can followup as needed.    Loralie Champagne 04/14/2017

## 2017-04-19 ENCOUNTER — Other Ambulatory Visit: Payer: Self-pay | Admitting: Hematology and Oncology

## 2020-08-05 ENCOUNTER — Encounter (HOSPITAL_COMMUNITY): Payer: Self-pay
